# Patient Record
Sex: Female | Born: 1935 | Race: White | Hispanic: No | Marital: Single | Smoking: Never smoker
Health system: Southern US, Community
[De-identification: ages and names within clinical notes are randomized; demographics above are authoritative.]

## PROBLEM LIST (undated history)

## (undated) DIAGNOSIS — I10 Essential (primary) hypertension: Secondary | ICD-10-CM

## (undated) DIAGNOSIS — R000 Tachycardia, unspecified: Secondary | ICD-10-CM

## (undated) DIAGNOSIS — J40 Bronchitis, not specified as acute or chronic: Secondary | ICD-10-CM

## (undated) DIAGNOSIS — F329 Major depressive disorder, single episode, unspecified: Secondary | ICD-10-CM

## (undated) DIAGNOSIS — F419 Anxiety disorder, unspecified: Secondary | ICD-10-CM

## (undated) DIAGNOSIS — F32A Depression, unspecified: Secondary | ICD-10-CM

## (undated) DIAGNOSIS — I6529 Occlusion and stenosis of unspecified carotid artery: Secondary | ICD-10-CM

## (undated) DIAGNOSIS — Z9109 Other allergy status, other than to drugs and biological substances: Secondary | ICD-10-CM

## (undated) DIAGNOSIS — E7800 Pure hypercholesterolemia, unspecified: Secondary | ICD-10-CM

## (undated) DIAGNOSIS — R053 Chronic cough: Secondary | ICD-10-CM

## (undated) DIAGNOSIS — R05 Cough: Secondary | ICD-10-CM

## (undated) DIAGNOSIS — G500 Trigeminal neuralgia: Secondary | ICD-10-CM

## (undated) DIAGNOSIS — K5790 Diverticulosis of intestine, part unspecified, without perforation or abscess without bleeding: Secondary | ICD-10-CM

## (undated) DIAGNOSIS — H9190 Unspecified hearing loss, unspecified ear: Secondary | ICD-10-CM

## (undated) DIAGNOSIS — K5792 Diverticulitis of intestine, part unspecified, without perforation or abscess without bleeding: Secondary | ICD-10-CM

## (undated) DIAGNOSIS — R002 Palpitations: Secondary | ICD-10-CM

## (undated) DIAGNOSIS — E039 Hypothyroidism, unspecified: Secondary | ICD-10-CM

## (undated) DIAGNOSIS — J349 Unspecified disorder of nose and nasal sinuses: Secondary | ICD-10-CM

## (undated) DIAGNOSIS — G629 Polyneuropathy, unspecified: Secondary | ICD-10-CM

## (undated) DIAGNOSIS — C50919 Malignant neoplasm of unspecified site of unspecified female breast: Secondary | ICD-10-CM

## (undated) DIAGNOSIS — M810 Age-related osteoporosis without current pathological fracture: Secondary | ICD-10-CM

## (undated) HISTORY — PX: MASTECTOMY: SHX3

## (undated) HISTORY — PX: THYROIDECTOMY, PARTIAL: SHX18

## (undated) HISTORY — PX: ABDOMINAL HYSTERECTOMY: SHX81

---

## 1999-02-06 ENCOUNTER — Ambulatory Visit (HOSPITAL_COMMUNITY): Admission: RE | Admit: 1999-02-06 | Discharge: 1999-02-06 | Payer: Self-pay | Admitting: Obstetrics & Gynecology

## 2004-02-14 ENCOUNTER — Other Ambulatory Visit: Payer: Self-pay

## 2004-02-15 ENCOUNTER — Other Ambulatory Visit: Payer: Self-pay

## 2004-10-04 ENCOUNTER — Ambulatory Visit: Payer: Self-pay | Admitting: Gastroenterology

## 2004-10-24 ENCOUNTER — Ambulatory Visit: Payer: Self-pay | Admitting: Gastroenterology

## 2004-10-26 ENCOUNTER — Ambulatory Visit: Payer: Self-pay | Admitting: Gastroenterology

## 2005-05-31 ENCOUNTER — Other Ambulatory Visit: Payer: Self-pay

## 2005-05-31 ENCOUNTER — Emergency Department: Payer: Self-pay | Admitting: Emergency Medicine

## 2007-04-04 ENCOUNTER — Emergency Department: Payer: Self-pay | Admitting: General Practice

## 2007-04-04 ENCOUNTER — Other Ambulatory Visit: Payer: Self-pay

## 2007-04-09 ENCOUNTER — Ambulatory Visit: Payer: Self-pay | Admitting: Family Medicine

## 2007-11-05 ENCOUNTER — Other Ambulatory Visit: Payer: Self-pay

## 2007-11-05 ENCOUNTER — Emergency Department: Payer: Self-pay | Admitting: Emergency Medicine

## 2007-11-10 ENCOUNTER — Emergency Department: Payer: Self-pay | Admitting: Emergency Medicine

## 2007-11-10 ENCOUNTER — Other Ambulatory Visit: Payer: Self-pay

## 2008-02-11 ENCOUNTER — Other Ambulatory Visit: Payer: Self-pay

## 2008-02-12 ENCOUNTER — Inpatient Hospital Stay: Payer: Self-pay | Admitting: Internal Medicine

## 2008-04-06 ENCOUNTER — Emergency Department: Payer: Self-pay | Admitting: Emergency Medicine

## 2008-04-06 ENCOUNTER — Other Ambulatory Visit: Payer: Self-pay

## 2009-08-02 ENCOUNTER — Ambulatory Visit: Payer: Self-pay | Admitting: Gastroenterology

## 2009-12-15 ENCOUNTER — Inpatient Hospital Stay: Payer: Self-pay | Admitting: Internal Medicine

## 2010-10-05 ENCOUNTER — Ambulatory Visit: Payer: Self-pay | Admitting: Unknown Physician Specialty

## 2011-06-11 ENCOUNTER — Ambulatory Visit: Payer: Self-pay | Admitting: Pain Medicine

## 2011-06-18 ENCOUNTER — Ambulatory Visit: Payer: Self-pay | Admitting: Pain Medicine

## 2011-06-20 ENCOUNTER — Ambulatory Visit: Payer: Self-pay | Admitting: Pain Medicine

## 2011-07-16 ENCOUNTER — Ambulatory Visit: Payer: Self-pay | Admitting: Pain Medicine

## 2011-08-20 ENCOUNTER — Ambulatory Visit: Payer: Self-pay | Admitting: Pain Medicine

## 2011-09-12 ENCOUNTER — Ambulatory Visit: Payer: Self-pay | Admitting: Pain Medicine

## 2011-10-01 DIAGNOSIS — G500 Trigeminal neuralgia: Secondary | ICD-10-CM | POA: Insufficient documentation

## 2011-10-10 ENCOUNTER — Ambulatory Visit: Payer: Self-pay | Admitting: Pain Medicine

## 2011-10-24 ENCOUNTER — Ambulatory Visit: Payer: Self-pay | Admitting: Pain Medicine

## 2011-12-26 ENCOUNTER — Ambulatory Visit: Payer: Self-pay | Admitting: Pain Medicine

## 2012-04-08 ENCOUNTER — Ambulatory Visit: Payer: Self-pay | Admitting: Pain Medicine

## 2012-04-10 DIAGNOSIS — C50919 Malignant neoplasm of unspecified site of unspecified female breast: Secondary | ICD-10-CM | POA: Insufficient documentation

## 2013-06-24 ENCOUNTER — Ambulatory Visit: Payer: Self-pay | Admitting: Family Medicine

## 2014-01-08 DIAGNOSIS — G629 Polyneuropathy, unspecified: Secondary | ICD-10-CM | POA: Insufficient documentation

## 2014-01-08 DIAGNOSIS — I4711 Inappropriate sinus tachycardia, so stated: Secondary | ICD-10-CM | POA: Insufficient documentation

## 2014-01-08 DIAGNOSIS — K219 Gastro-esophageal reflux disease without esophagitis: Secondary | ICD-10-CM | POA: Insufficient documentation

## 2014-01-08 DIAGNOSIS — E039 Hypothyroidism, unspecified: Secondary | ICD-10-CM | POA: Diagnosis present

## 2014-01-08 DIAGNOSIS — E782 Mixed hyperlipidemia: Secondary | ICD-10-CM | POA: Insufficient documentation

## 2014-12-08 DIAGNOSIS — I071 Rheumatic tricuspid insufficiency: Secondary | ICD-10-CM | POA: Insufficient documentation

## 2014-12-08 DIAGNOSIS — I340 Nonrheumatic mitral (valve) insufficiency: Secondary | ICD-10-CM | POA: Insufficient documentation

## 2014-12-08 DIAGNOSIS — I6523 Occlusion and stenosis of bilateral carotid arteries: Secondary | ICD-10-CM | POA: Insufficient documentation

## 2015-04-05 ENCOUNTER — Other Ambulatory Visit: Payer: Self-pay | Admitting: Family Medicine

## 2015-04-05 DIAGNOSIS — R599 Enlarged lymph nodes, unspecified: Secondary | ICD-10-CM

## 2015-04-12 ENCOUNTER — Ambulatory Visit
Admission: RE | Admit: 2015-04-12 | Discharge: 2015-04-12 | Disposition: A | Discharge status: Home / Self Care | Payer: Medicare Other | Source: Ambulatory Visit | Attending: Family Medicine | Admitting: Family Medicine

## 2015-04-12 DIAGNOSIS — R599 Enlarged lymph nodes, unspecified: Secondary | ICD-10-CM

## 2015-04-12 DIAGNOSIS — R938 Abnormal findings on diagnostic imaging of other specified body structures: Secondary | ICD-10-CM | POA: Insufficient documentation

## 2015-04-20 ENCOUNTER — Encounter
Admission: RE | Admit: 2015-04-20 | Discharge: 2015-04-20 | Disposition: A | Discharge status: Home / Self Care | Payer: Medicare Other | Source: Ambulatory Visit | Attending: Ophthalmology | Admitting: Ophthalmology

## 2015-04-20 DIAGNOSIS — I10 Essential (primary) hypertension: Secondary | ICD-10-CM | POA: Insufficient documentation

## 2015-04-20 DIAGNOSIS — Z01810 Encounter for preprocedural cardiovascular examination: Secondary | ICD-10-CM | POA: Diagnosis present

## 2015-04-20 HISTORY — DX: Diverticulosis of intestine, part unspecified, without perforation or abscess without bleeding: K57.90

## 2015-04-20 HISTORY — DX: Polyneuropathy, unspecified: G62.9

## 2015-04-20 HISTORY — DX: Diverticulitis of intestine, part unspecified, without perforation or abscess without bleeding: K57.92

## 2015-04-20 HISTORY — DX: Palpitations: R00.2

## 2015-04-20 HISTORY — DX: Chronic cough: R05.3

## 2015-04-20 HISTORY — DX: Malignant neoplasm of unspecified site of unspecified female breast: C50.919

## 2015-04-20 HISTORY — DX: Age-related osteoporosis without current pathological fracture: M81.0

## 2015-04-20 HISTORY — DX: Occlusion and stenosis of unspecified carotid artery: I65.29

## 2015-04-20 HISTORY — DX: Essential (primary) hypertension: I10

## 2015-04-20 HISTORY — DX: Unspecified disorder of nose and nasal sinuses: J34.9

## 2015-04-20 HISTORY — DX: Trigeminal neuralgia: G50.0

## 2015-04-20 HISTORY — DX: Hypothyroidism, unspecified: E03.9

## 2015-04-20 HISTORY — DX: Unspecified hearing loss, unspecified ear: H91.90

## 2015-04-20 HISTORY — DX: Anxiety disorder, unspecified: F41.9

## 2015-04-20 HISTORY — DX: Other allergy status, other than to drugs and biological substances: Z91.09

## 2015-04-20 HISTORY — DX: Tachycardia, unspecified: R00.0

## 2015-04-20 HISTORY — DX: Bronchitis, not specified as acute or chronic: J40

## 2015-04-20 HISTORY — DX: Pure hypercholesterolemia, unspecified: E78.00

## 2015-04-20 HISTORY — DX: Cough: R05

## 2015-04-21 NOTE — OR Nursing (Signed)
Ekg reviewed by Dr Polin and ok 

## 2015-05-03 ENCOUNTER — Encounter
Admission: RE | Disposition: A | Discharge status: Home / Self Care | Payer: Self-pay | Source: Ambulatory Visit | Attending: Ophthalmology

## 2015-05-03 ENCOUNTER — Encounter: Payer: Self-pay | Admitting: Ophthalmology

## 2015-05-03 ENCOUNTER — Ambulatory Visit
Admission: RE | Admit: 2015-05-03 | Discharge: 2015-05-03 | Disposition: A | Discharge status: Home / Self Care | Payer: Medicare Other | Source: Ambulatory Visit | Attending: Ophthalmology | Admitting: Ophthalmology

## 2015-05-03 ENCOUNTER — Ambulatory Visit: Payer: Medicare Other | Admitting: Anesthesiology

## 2015-05-03 DIAGNOSIS — M810 Age-related osteoporosis without current pathological fracture: Secondary | ICD-10-CM | POA: Insufficient documentation

## 2015-05-03 DIAGNOSIS — Z79899 Other long term (current) drug therapy: Secondary | ICD-10-CM | POA: Diagnosis not present

## 2015-05-03 DIAGNOSIS — E039 Hypothyroidism, unspecified: Secondary | ICD-10-CM | POA: Insufficient documentation

## 2015-05-03 DIAGNOSIS — Z7982 Long term (current) use of aspirin: Secondary | ICD-10-CM | POA: Insufficient documentation

## 2015-05-03 DIAGNOSIS — Z853 Personal history of malignant neoplasm of breast: Secondary | ICD-10-CM | POA: Diagnosis not present

## 2015-05-03 DIAGNOSIS — K219 Gastro-esophageal reflux disease without esophagitis: Secondary | ICD-10-CM | POA: Insufficient documentation

## 2015-05-03 DIAGNOSIS — E780 Pure hypercholesterolemia: Secondary | ICD-10-CM | POA: Insufficient documentation

## 2015-05-03 DIAGNOSIS — I10 Essential (primary) hypertension: Secondary | ICD-10-CM | POA: Diagnosis not present

## 2015-05-03 DIAGNOSIS — H2511 Age-related nuclear cataract, right eye: Secondary | ICD-10-CM | POA: Diagnosis not present

## 2015-05-03 DIAGNOSIS — Z880 Allergy status to penicillin: Secondary | ICD-10-CM | POA: Diagnosis not present

## 2015-05-03 DIAGNOSIS — E119 Type 2 diabetes mellitus without complications: Secondary | ICD-10-CM | POA: Insufficient documentation

## 2015-05-03 DIAGNOSIS — Z885 Allergy status to narcotic agent status: Secondary | ICD-10-CM | POA: Insufficient documentation

## 2015-05-03 DIAGNOSIS — I739 Peripheral vascular disease, unspecified: Secondary | ICD-10-CM | POA: Diagnosis not present

## 2015-05-03 DIAGNOSIS — H9190 Unspecified hearing loss, unspecified ear: Secondary | ICD-10-CM | POA: Diagnosis not present

## 2015-05-03 DIAGNOSIS — Z881 Allergy status to other antibiotic agents status: Secondary | ICD-10-CM | POA: Insufficient documentation

## 2015-05-03 HISTORY — PX: CATARACT EXTRACTION W/PHACO: SHX586

## 2015-05-03 SURGERY — PHACOEMULSIFICATION, CATARACT, WITH IOL INSERTION
Anesthesia: Monitor Anesthesia Care | Site: Eye | Laterality: Right | Wound class: Clean

## 2015-05-03 MED ORDER — NA CHONDROIT SULF-NA HYALURON 40-17 MG/ML IO SOLN
INTRAOCULAR | Status: AC
  Filled 2015-05-03: qty 1

## 2015-05-03 MED ORDER — MOXIFLOXACIN HCL 0.5 % OP SOLN: 1.0000 [drp] | OPHTHALMIC | Status: DC | PRN

## 2015-05-03 MED ORDER — MIDAZOLAM HCL 2 MG/2ML IJ SOLN
INTRAMUSCULAR | Status: DC | PRN
  Administered 2015-05-03: 1 mg via INTRAVENOUS

## 2015-05-03 MED ORDER — EPINEPHRINE HCL 1 MG/ML IJ SOLN
INTRAMUSCULAR | Status: AC
  Filled 2015-05-03: qty 1

## 2015-05-03 MED ORDER — SODIUM CHLORIDE 0.9 % IV SOLN
INTRAVENOUS | Status: DC
  Administered 2015-05-03: 10:00:00 via INTRAVENOUS

## 2015-05-03 MED ORDER — CARBACHOL 0.01 % IO SOLN
INTRAOCULAR | Status: DC | PRN
  Administered 2015-05-03: 0.5 mL via INTRAOCULAR

## 2015-05-03 MED ORDER — ARMC OPHTHALMIC DILATING GEL
1.0000 "application " | Freq: Once | OPHTHALMIC | Status: AC
  Administered 2015-05-03 (×2): 1 via OPHTHALMIC

## 2015-05-03 MED ORDER — TETRACAINE HCL 0.5 % OP SOLN
1.0000 [drp] | Freq: Once | OPHTHALMIC | Status: AC
  Administered 2015-05-03: 1 [drp] via OPHTHALMIC

## 2015-05-03 MED ORDER — POVIDONE-IODINE 5 % OP SOLN
1.0000 "application " | Freq: Once | OPHTHALMIC | Status: AC
  Administered 2015-05-03: 1 via OPHTHALMIC

## 2015-05-03 MED ORDER — LACTATED RINGERS IV SOLN: INTRAVENOUS | Status: DC

## 2015-05-03 MED ORDER — CEFUROXIME OPHTHALMIC INJECTION 1 MG/0.1 ML
INJECTION | OPHTHALMIC | Status: AC
  Filled 2015-05-03: qty 0.1

## 2015-05-03 MED ORDER — EPINEPHRINE HCL 1 MG/ML IJ SOLN
INTRAOCULAR | Status: DC | PRN
  Administered 2015-05-03: 200 mL

## 2015-05-03 MED ORDER — MOXIFLOXACIN HCL 0.5 % OP SOLN
OPHTHALMIC | Status: DC | PRN
  Administered 2015-05-03: 1 [drp] via OPHTHALMIC

## 2015-05-03 SURGICAL SUPPLY — 22 items
CANNULA ANT/CHMB 27GA (MISCELLANEOUS) ×3 IMPLANT
GLOVE BIO SURGEON STRL SZ8 (GLOVE) ×3 IMPLANT
GLOVE BIOGEL M 6.5 STRL (GLOVE) ×3 IMPLANT
GLOVE SURG LX 8.0 MICRO (GLOVE) ×2
GLOVE SURG LX STRL 8.0 MICRO (GLOVE) ×1 IMPLANT
GOWN STRL REUS W/ TWL LRG LVL3 (GOWN DISPOSABLE) ×2 IMPLANT
GOWN STRL REUS W/TWL LRG LVL3 (GOWN DISPOSABLE) ×6
LENS IOL TECNIS 24.0 (Intraocular Lens) ×3 IMPLANT
LENS IOL TECNIS MONO 1P 24.0 (Intraocular Lens) ×1 IMPLANT
PACK CATARACT (MISCELLANEOUS) ×3 IMPLANT
PACK CATARACT BRASINGTON LX (MISCELLANEOUS) ×3 IMPLANT
PACK EYE AFTER SURG (MISCELLANEOUS) ×3 IMPLANT
PACK VITRECTOMY CASSETTE 23GA (MISCELLANEOUS) IMPLANT
PACK VITRECTOMY CASSETTE 25GA (MISCELLANEOUS) IMPLANT
SOL BSS BAG (MISCELLANEOUS) ×3
SOL PREP PVP 2OZ (MISCELLANEOUS) ×3
SOLUTION BSS BAG (MISCELLANEOUS) ×1 IMPLANT
SOLUTION PREP PVP 2OZ (MISCELLANEOUS) ×1 IMPLANT
SYR 5ML LL (SYRINGE) ×3 IMPLANT
SYR TB 1ML 27GX1/2 LL (SYRINGE) ×3 IMPLANT
WATER STERILE IRR 1000ML POUR (IV SOLUTION) ×3 IMPLANT
WIPE NON LINTING 3.25X3.25 (MISCELLANEOUS) ×3 IMPLANT

## 2015-05-03 NOTE — H&P (Signed)
  All labs reviewed. Abnormal studies sent to patients PCP when indicated.  Previous H&P reviewed, patient examined, there are NO CHANGES.  Bonnie Barker LOUIS6/28/201610:58 AM

## 2015-05-03 NOTE — Anesthesia Preprocedure Evaluation (Signed)
Anesthesia Evaluation  Patient identified by MRN, date of birth, ID band Patient awake    Reviewed: Allergy & Precautions, H&P , NPO status , Patient's Chart, lab work & pertinent test results, reviewed documented beta blocker date and time   Airway Mallampati: II  TM Distance: >3 FB Neck ROM: full    Dental no notable dental hx. (+) Teeth Intact   Pulmonary neg pulmonary ROS,  breath sounds clear to auscultation  Pulmonary exam normal       Cardiovascular Exercise Tolerance: Good hypertension, + Peripheral Vascular Disease negative cardio ROS  Rhythm:regular Rate:Normal     Neuro/Psych  Neuromuscular disease negative neurological ROS  negative psych ROS   GI/Hepatic negative GI ROS, Neg liver ROS,   Endo/Other  negative endocrine ROSdiabetesHypothyroidism   Renal/GU      Musculoskeletal   Abdominal   Peds  Hematology negative hematology ROS (+)   Anesthesia Other Findings   Reproductive/Obstetrics negative OB ROS                             Anesthesia Physical Anesthesia Plan  ASA: III  Anesthesia Plan: MAC   Post-op Pain Management:    Induction:   Airway Management Planned:   Additional Equipment:   Intra-op Plan:   Post-operative Plan:   Informed Consent: I have reviewed the patients History and Physical, chart, labs and discussed the procedure including the risks, benefits and alternatives for the proposed anesthesia with the patient or authorized representative who has indicated his/her understanding and acceptance.     Plan Discussed with: CRNA  Anesthesia Plan Comments:         Anesthesia Quick Evaluation

## 2015-05-03 NOTE — Op Note (Signed)
PREOPERATIVE DIAGNOSIS:  Nuclear sclerotic cataract of the right eye.   POSTOPERATIVE DIAGNOSIS:  nuclear sclerotic cataract right eye   OPERATIVE PROCEDURE: Procedure(s): CATARACT EXTRACTION PHACO AND INTRAOCULAR LENS PLACEMENT (IOC)   SURGEON:  Birder Robson, MD.   ANESTHESIA:  Anesthesiologist: Molli Barrows, MD CRNA: Aline Brochure, CRNA  1.      Managed anesthesia care. 2.      Topical tetracaine drops followed by 2% Xylocaine jelly applied in the preoperative holding area.   COMPLICATIONS:  None.   TECHNIQUE:   Stop and chop   DESCRIPTION OF PROCEDURE:  The patient was examined and consented in the preoperative holding area where the aforementioned topical anesthesia was applied to the right eye and then brought back to the Operating Room where the right eye was prepped and draped in the usual sterile ophthalmic fashion and a lid speculum was placed. A paracentesis was created with the side port blade and the anterior chamber was filled with viscoelastic. A near clear corneal incision was performed with the steel keratome. A continuous curvilinear capsulorrhexis was performed with a cystotome followed by the capsulorrhexis forceps. Hydrodissection and hydrodelineation were carried out with BSS on a blunt cannula. The lens was removed in a stop and chop  technique and the remaining cortical material was removed with the irrigation-aspiration handpiece. The capsular bag was inflated with viscoelastic and the Technis ZCB00  lens was placed in the capsular bag without complication. The remaining viscoelastic was removed from the eye with the irrigation-aspiration handpiece. The wounds were hydrated. The anterior chamber was flushed with Miostat and the eye was inflated to physiologic pressure. 0.2 mL of Vigamox diluted three/one with BSS was placed in the anterior chamber. The wounds were found to be water tight. The eye was dressed with Vigamox. The patient was given protective glasses  to wear throughout the day and a shield with which to sleep tonight. The patient was also given drops with which to begin a drop regimen today and will follow-up with me in one day.  Implant Name Type Inv. Item Serial No. Manufacturer Lot No. LRB No. Used  LENS IMPL INTRAOC ZCB00 24.0 - [id number] Intraocular Lens LENS IMPL INTRAOC ZCB00 24.0 [id number] AMO   Right 1   Procedure(s) with comments: CATARACT EXTRACTION PHACO AND INTRAOCULAR LENS PLACEMENT (IOC) (Right) - [place] 00:54 AP% 18.8 CDE 10.21 fluid pack lot # [id number]  Electronically signed: [place] 05/03/2015 11:24 AM

## 2015-05-03 NOTE — Discharge Instructions (Addendum)
See cataract post op handout  Eye Surgery Discharge Instructions  Expect mild scratchy sensation or mild soreness. DO NOT RUB YOUR EYE!  The day of surgery:  Minimal physical activity, but bed rest is not required  No reading, computer work, or close hand work  No bending, lifting, or straining.  May watch TV  For 24 hours:  No driving, legal decisions, or alcoholic beverages  Safety precautions  Eat anything you prefer: It is better to start with liquids, then soup then solid foods.  _____ Eye patch should be worn until postoperative exam tomorrow.  ____ Solar shield eyeglasses should be worn for comfort in the sunlight/patch while sleeping  Resume all regular medications including aspirin or Coumadin if these were discontinued prior to surgery. You may shower, bathe, shave, or wash your hair. Tylenol may be taken for mild discomfort.  Call your doctor if you experience significant pain, nausea, or vomiting, fever > 101 or other signs of infection. [phone] or [phone] Specific instructions:  Follow-up Information    Follow up with Tim Lair, MD On 05/04/2015.   Specialty:  Ophthalmology   Why:  8:30   Contact information:   [address] [place] [address] [phone]      Eye Surgery Discharge Instructions  Expect mild scratchy sensation or mild soreness. DO NOT RUB YOUR EYE!  The day of surgery:  Minimal physical activity, but bed rest is not required  No reading, computer work, or close hand work  No bending, lifting, or straining.  May watch TV  For 24 hours:  No driving, legal decisions, or alcoholic beverages  Safety precautions  Eat anything you prefer: It is better to start with liquids, then soup then solid foods.  _____ Eye patch should be worn until postoperative exam tomorrow.  ____ Solar shield eyeglasses should be worn for comfort in the sunlight/patch while sleeping  Resume all regular medications  including aspirin or Coumadin if these were discontinued prior to surgery. You may shower, bathe, shave, or wash your hair. Tylenol may be taken for mild discomfort.  Call your doctor if you experience significant pain, nausea, or vomiting, fever > 101 or other signs of infection. [phone] or [phone] Specific instructions:  Follow-up Information    Follow up with Tim Lair, MD On 05/04/2015.   Specialty:  Ophthalmology   Why:  8:30   Contact information:   [address] [place] [address] [phone]

## 2015-05-03 NOTE — Anesthesia Postprocedure Evaluation (Signed)
  Anesthesia Post-op Note  Patient: Bonnie Barker  Procedure(s) Performed: Procedure(s) with comments: CATARACT EXTRACTION PHACO AND INTRAOCULAR LENS PLACEMENT (IOC) (Right) - [place] 00:54 AP% 18.8 CDE 10.21 fluid pack lot # [id number]  Anesthesia type:MAC  Patient location: PACU  Post pain: Pain level controlled  Post assessment: Post-op Vital signs reviewed, Patient's Cardiovascular Status Stable, Respiratory Function Stable, Patent Airway and No signs of Nausea or vomiting  Post vital signs: Reviewed and stable  Last Vitals:  Filed Vitals:   05/03/15 1126  BP: 162/90  Pulse: 92  Temp: 36.1 C  Resp: 12    Level of consciousness: awake, alert  and patient cooperative  Complications: No apparent anesthesia complications

## 2015-05-03 NOTE — Transfer of Care (Signed)
Immediate Anesthesia Transfer of Care Note  Patient: Bonnie Barker  Procedure(s) Performed: Procedure(s) with comments: CATARACT EXTRACTION PHACO AND INTRAOCULAR LENS PLACEMENT (IOC) (Right) - [place] 00:54 AP% 18.8 CDE 10.21 fluid pack lot # [id number]  Patient Location: PACU  Anesthesia Type:MAC  Level of Consciousness: awake, alert  and oriented  Airway & Oxygen Therapy: Patient Spontanous Breathing  Post-op Assessment: Report given to RN and Post -op Vital signs reviewed and stable  Post vital signs: stable  Last Vitals:  Filed Vitals:   05/03/15 1126  BP: 162/90  Pulse: 92  Temp: 36.1 C  Resp: 12    Complications: No apparent anesthesia complications

## 2015-05-13 DIAGNOSIS — H2512 Age-related nuclear cataract, left eye: Secondary | ICD-10-CM | POA: Diagnosis present

## 2015-05-13 DIAGNOSIS — I739 Peripheral vascular disease, unspecified: Secondary | ICD-10-CM | POA: Diagnosis not present

## 2015-05-13 DIAGNOSIS — G709 Myoneural disorder, unspecified: Secondary | ICD-10-CM | POA: Diagnosis not present

## 2015-05-13 DIAGNOSIS — Z792 Long term (current) use of antibiotics: Secondary | ICD-10-CM | POA: Diagnosis not present

## 2015-05-13 DIAGNOSIS — E039 Hypothyroidism, unspecified: Secondary | ICD-10-CM | POA: Diagnosis not present

## 2015-05-13 DIAGNOSIS — F419 Anxiety disorder, unspecified: Secondary | ICD-10-CM | POA: Diagnosis not present

## 2015-05-13 DIAGNOSIS — K219 Gastro-esophageal reflux disease without esophagitis: Secondary | ICD-10-CM | POA: Diagnosis not present

## 2015-05-13 DIAGNOSIS — F329 Major depressive disorder, single episode, unspecified: Secondary | ICD-10-CM | POA: Diagnosis not present

## 2015-05-13 DIAGNOSIS — Z7983 Long term (current) use of bisphosphonates: Secondary | ICD-10-CM | POA: Diagnosis not present

## 2015-05-13 DIAGNOSIS — I10 Essential (primary) hypertension: Secondary | ICD-10-CM | POA: Diagnosis not present

## 2015-05-13 DIAGNOSIS — Z79899 Other long term (current) drug therapy: Secondary | ICD-10-CM | POA: Diagnosis not present

## 2015-05-17 ENCOUNTER — Ambulatory Visit: Payer: Medicare Other | Admitting: Anesthesiology

## 2015-05-17 ENCOUNTER — Encounter: Payer: Self-pay | Admitting: *Deleted

## 2015-05-17 ENCOUNTER — Ambulatory Visit
Admission: RE | Admit: 2015-05-17 | Discharge: 2015-05-17 | Disposition: A | Discharge status: Home / Self Care | Payer: Medicare Other | Source: Ambulatory Visit | Attending: Ophthalmology | Admitting: Ophthalmology

## 2015-05-17 ENCOUNTER — Encounter
Admission: RE | Disposition: A | Discharge status: Home / Self Care | Payer: Self-pay | Source: Ambulatory Visit | Attending: Ophthalmology

## 2015-05-17 DIAGNOSIS — G709 Myoneural disorder, unspecified: Secondary | ICD-10-CM | POA: Insufficient documentation

## 2015-05-17 DIAGNOSIS — K219 Gastro-esophageal reflux disease without esophagitis: Secondary | ICD-10-CM | POA: Insufficient documentation

## 2015-05-17 DIAGNOSIS — I739 Peripheral vascular disease, unspecified: Secondary | ICD-10-CM | POA: Insufficient documentation

## 2015-05-17 DIAGNOSIS — F419 Anxiety disorder, unspecified: Secondary | ICD-10-CM | POA: Insufficient documentation

## 2015-05-17 DIAGNOSIS — Z7983 Long term (current) use of bisphosphonates: Secondary | ICD-10-CM | POA: Insufficient documentation

## 2015-05-17 DIAGNOSIS — I10 Essential (primary) hypertension: Secondary | ICD-10-CM | POA: Insufficient documentation

## 2015-05-17 DIAGNOSIS — Z79899 Other long term (current) drug therapy: Secondary | ICD-10-CM | POA: Insufficient documentation

## 2015-05-17 DIAGNOSIS — E039 Hypothyroidism, unspecified: Secondary | ICD-10-CM | POA: Insufficient documentation

## 2015-05-17 DIAGNOSIS — Z792 Long term (current) use of antibiotics: Secondary | ICD-10-CM | POA: Insufficient documentation

## 2015-05-17 DIAGNOSIS — H2512 Age-related nuclear cataract, left eye: Secondary | ICD-10-CM | POA: Diagnosis not present

## 2015-05-17 DIAGNOSIS — F329 Major depressive disorder, single episode, unspecified: Secondary | ICD-10-CM | POA: Insufficient documentation

## 2015-05-17 HISTORY — DX: Depression, unspecified: F32.A

## 2015-05-17 HISTORY — PX: CATARACT EXTRACTION W/PHACO: SHX586

## 2015-05-17 HISTORY — DX: Major depressive disorder, single episode, unspecified: F32.9

## 2015-05-17 SURGERY — PHACOEMULSIFICATION, CATARACT, WITH IOL INSERTION
Anesthesia: Monitor Anesthesia Care | Site: Eye | Laterality: Left

## 2015-05-17 MED ORDER — MOXIFLOXACIN HCL 0.5 % OP SOLN: 1.0000 [drp] | OPHTHALMIC | Status: AC | PRN

## 2015-05-17 MED ORDER — MOXIFLOXACIN HCL 0.5 % OP SOLN
OPHTHALMIC | Status: AC
  Filled 2015-05-17: qty 3

## 2015-05-17 MED ORDER — MIDAZOLAM HCL 2 MG/2ML IJ SOLN
INTRAMUSCULAR | Status: DC | PRN
  Administered 2015-05-17 (×2): .5 mg via INTRAVENOUS

## 2015-05-17 MED ORDER — ARMC OPHTHALMIC DILATING GEL
OPHTHALMIC | Status: AC
  Filled 2015-05-17: qty 0.25

## 2015-05-17 MED ORDER — EPINEPHRINE HCL 1 MG/ML IJ SOLN
INTRAOCULAR | Status: DC | PRN
  Administered 2015-05-17: 200 mL

## 2015-05-17 MED ORDER — POVIDONE-IODINE 5 % OP SOLN
OPHTHALMIC | Status: AC
  Administered 2015-05-17: 1 via OPHTHALMIC
  Filled 2015-05-17: qty 30

## 2015-05-17 MED ORDER — MOXIFLOXACIN HCL 0.5 % OP SOLN
OPHTHALMIC | Status: DC | PRN
Start: 2015-05-17 — End: 2015-05-17
  Administered 2015-05-17: 1 [drp] via OPHTHALMIC

## 2015-05-17 MED ORDER — FENTANYL CITRATE (PF) 100 MCG/2ML IJ SOLN
INTRAMUSCULAR | Status: DC | PRN
  Administered 2015-05-17 (×2): 25 ug via INTRAVENOUS

## 2015-05-17 MED ORDER — SODIUM CHLORIDE 0.9 % IV SOLN
INTRAVENOUS | Status: DC
  Administered 2015-05-17 (×2): via INTRAVENOUS

## 2015-05-17 MED ORDER — EPINEPHRINE HCL 1 MG/ML IJ SOLN
INTRAMUSCULAR | Status: AC
  Filled 2015-05-17: qty 1

## 2015-05-17 MED ORDER — CEFUROXIME OPHTHALMIC INJECTION 1 MG/0.1 ML
INJECTION | OPHTHALMIC | Status: AC
  Filled 2015-05-17: qty 0.1

## 2015-05-17 MED ORDER — TETRACAINE HCL 0.5 % OP SOLN
OPHTHALMIC | Status: AC
  Filled 2015-05-17: qty 2

## 2015-05-17 MED ORDER — CARBACHOL 0.01 % IO SOLN
INTRAOCULAR | Status: DC | PRN
Start: 2015-05-17 — End: 2015-05-17
  Administered 2015-05-17: 0.5 mL via INTRAOCULAR

## 2015-05-17 MED ORDER — NA CHONDROIT SULF-NA HYALURON 40-17 MG/ML IO SOLN
INTRAOCULAR | Status: AC
  Filled 2015-05-17: qty 1

## 2015-05-17 MED ORDER — TETRACAINE HCL 0.5 % OP SOLN
1.0000 [drp] | OPHTHALMIC | Status: DC | PRN
  Administered 2015-05-17: 1 [drp] via OPHTHALMIC

## 2015-05-17 MED ORDER — ARMC OPHTHALMIC DILATING GEL
1.0000 "application " | OPHTHALMIC | Status: DC | PRN
  Administered 2015-05-17 (×2): 1 via OPHTHALMIC

## 2015-05-17 MED ORDER — POVIDONE-IODINE 5 % OP SOLN
1.0000 "application " | OPHTHALMIC | Status: DC | PRN
  Administered 2015-05-17: 1 via OPHTHALMIC

## 2015-05-17 SURGICAL SUPPLY — 20 items
CANNULA ANT/CHMB 27GA (MISCELLANEOUS) ×2 IMPLANT
GLOVE BIO SURGEON STRL SZ8 (GLOVE) ×2 IMPLANT
GLOVE BIOGEL M 6.5 STRL (GLOVE) ×2 IMPLANT
GLOVE SURG LX 8.0 MICRO (GLOVE) ×1
GLOVE SURG LX STRL 8.0 MICRO (GLOVE) ×1 IMPLANT
GOWN STRL REUS W/ TWL LRG LVL3 (GOWN DISPOSABLE) ×2 IMPLANT
GOWN STRL REUS W/TWL LRG LVL3 (GOWN DISPOSABLE) ×4
LENS IOL TECNIS 23.5 (Intraocular Lens) ×2 IMPLANT
LENS IOL TECNIS MONO 1P 23.5 (Intraocular Lens) ×1 IMPLANT
PACK CATARACT (MISCELLANEOUS) ×2 IMPLANT
PACK CATARACT BRASINGTON LX (MISCELLANEOUS) ×2 IMPLANT
PACK EYE AFTER SURG (MISCELLANEOUS) ×2 IMPLANT
SOL BSS BAG (MISCELLANEOUS) ×2
SOL PREP PVP 2OZ (MISCELLANEOUS) ×2
SOLUTION BSS BAG (MISCELLANEOUS) ×1 IMPLANT
SOLUTION PREP PVP 2OZ (MISCELLANEOUS) ×1 IMPLANT
SYR 5ML LL (SYRINGE) ×2 IMPLANT
SYR TB 1ML 27GX1/2 LL (SYRINGE) ×2 IMPLANT
WATER STERILE IRR 1000ML POUR (IV SOLUTION) ×2 IMPLANT
WIPE NON LINTING 3.25X3.25 (MISCELLANEOUS) ×2 IMPLANT

## 2015-05-17 NOTE — Discharge Instructions (Signed)
AMBULATORY SURGERY  °DISCHARGE INSTRUCTIONS ° ° °1) The drugs that you were given will stay in your system until tomorrow so for the next 24 hours you should not: ° °A) Drive an automobile °B) Make any legal decisions °C) Drink any alcoholic beverage ° ° °2) You may resume regular meals tomorrow.  Today it is better to start with liquids and gradually work up to solid foods. ° °You may eat anything you prefer, but it is better to start with liquids, then soup and crackers, and gradually work up to solid foods. ° ° °3) Please notify your doctor immediately if you have any unusual bleeding, trouble breathing, redness and pain at the surgery site, drainage, fever, or pain not relieved by medication. ° ° ° °4) Additional Instructions: ° ° ° °Cataract Surgery °Care After °Refer to this sheet in the next few weeks. These instructions provide you with information on caring for yourself after your procedure. Your caregiver may also give you more specific instructions. Your treatment has been planned according to current medical practices, but problems sometimes occur. Call your caregiver if you have any problems or questions after your procedure.  °HOME CARE INSTRUCTIONS  °· Avoid strenuous activities as directed by your caregiver. °· Ask your caregiver when you can resume driving. °· Use eyedrops or other medicines to help healing and control pressure inside your eye as directed by your caregiver. °· Only take over-the-counter or prescription medicines for pain, discomfort, or fever as directed by your caregiver. °· Do not to touch or rub your eyes. °· You may be instructed to use a protective shield during the first few days and nights after surgery. If not, wear sunglasses to protect your eyes. This is to protect the eye from pressure or from being accidentally bumped. °· Keep the area around your eye clean and dry. Avoid swimming or allowing water to hit you directly in the face while showering. Keep soap and shampoo  out of your eyes. °· Do not bend or lift heavy objects. Bending increases pressure in the eye. You can walk, climb stairs, and do light household chores. °· Do not put a contact lens into the eye that had surgery until your caregiver says it is okay to do so. °· Ask your doctor when you can return to work. This will depend on the kind of work that you do. If you work in a dusty environment, you may be advised to wear protective eyewear for a period of time. °· Ask your caregiver when it will be safe to engage in sexual activity. °· Continue with your regular eye exams as directed by your caregiver. °What to expect: °· It is normal to feel itching and mild discomfort for a few days after cataract surgery. Some fluid discharge is also common, and your eye may be sensitive to light and touch. °· After 1 to 2 days, even moderate discomfort should disappear. In most cases, healing will take about 6 weeks. °· If you received an intraocular lens (IOL), you may notice that colors are very bright or have a blue tinge. Also, if you have been in bright sunlight, everything may appear reddish for a few hours. If you see these color tinges, it is because your lens is clear and no longer cloudy. Within a few months after receiving an IOL, these extra colors should go away. When you have healed, you will probably need new glasses. °SEEK MEDICAL CARE IF:  °· You have increased bruising around your   eye. °· You have discomfort not helped by medicine. °SEEK IMMEDIATE MEDICAL CARE IF:  °· You have a  fever. °· You have a worsening or sudden vision loss. °· You have redness, swelling, or increasing pain in the eye. °· You have a thick discharge from the eye that had surgery. °MAKE SURE YOU: °· Understand these instructions. °· Will watch your condition. °· Will get help right away if you are not doing well or get worse. °Document Released: 05/11/2005 Document Revised: 01/14/2012 Document Reviewed: 06/15/2011 °[employer]® Patient  Information ©2015 [employer]. This information is not intended to replace advice given to you by your health care provider. Make sure you discuss any questions you have with your health care provider. ° ° ° ° °Please contact your physician with any problems or Same Day Surgery at [phone], Monday through Friday 6 am to 4 pm, or [facility] Main number at [phone]. °

## 2015-05-17 NOTE — Op Note (Signed)
PREOPERATIVE DIAGNOSIS:  Nuclear sclerotic cataract of the left eye.   POSTOPERATIVE DIAGNOSIS:  Nuclear sclerotic cataract of the left eye.   OPERATIVE PROCEDURE: Procedure(s): CATARACT EXTRACTION PHACO AND INTRAOCULAR LENS PLACEMENT (IOC)   SURGEON:  Birder Robson, MD.   ANESTHESIA:  Anesthesiologist: Gunnar Bulla, MD CRNA: Nelda Marseille, CRNA  1.      Managed anesthesia care. 2.      Topical tetracaine drops followed by 2% Xylocaine jelly applied in the preoperative holding area.   COMPLICATIONS:  None.   TECHNIQUE:   Stop and chop   DESCRIPTION OF PROCEDURE:  The patient was examined and consented in the preoperative holding area where the aforementioned topical anesthesia was applied to the left eye and then brought back to the Operating Room where the left eye was prepped and draped in the usual sterile ophthalmic fashion and a lid speculum was placed. A paracentesis was created with the side port blade and the anterior chamber was filled with viscoelastic. A near clear corneal incision was performed with the steel keratome. A continuous curvilinear capsulorrhexis was performed with a cystotome followed by the capsulorrhexis forceps. Hydrodissection and hydrodelineation were carried out with BSS on a blunt cannula. The lens was removed in a stop and chop  technique and the remaining cortical material was removed with the irrigation-aspiration handpiece. The capsular bag was inflated with viscoelastic and the Technis ZCB00 lens was placed in the capsular bag without complication. The remaining viscoelastic was removed from the eye with the irrigation-aspiration handpiece. The wounds were hydrated. The anterior chamber was flushed with Miostat and the eye was inflated to physiologic pressure. 0.2 mL of Vigamox diluted three/one with BSS was placed in the anterior chamber. The wounds were found to be water tight. The eye was dressed with Vigamox. The patient was given protective glasses to  wear throughout the day and a shield with which to sleep tonight. The patient was also given drops with which to begin a drop regimen today and will follow-up with me in one day.  Implant Name Type Inv. Item Serial No. Manufacturer Lot No. LRB No. Used  LENS IMPL INTRAOC ZCB00 23.5 - [id number] Intraocular Lens LENS IMPL INTRAOC ZCB00 23.5 [id number] AMO   Left 1    Procedure(s) with comments: CATARACT EXTRACTION PHACO AND INTRAOCULAR LENS PLACEMENT (IOC) (Left) - [place] 01:00.5 AP% 17.8 CDE 10.76 fluid pack lot # [id number]  Electronically signed: [place] 05/17/2015 10:55 AM

## 2015-05-17 NOTE — Transfer of Care (Signed)
Immediate Anesthesia Transfer of Care Note  Patient: Bonnie Barker  Procedure(s) Performed: Procedure(s) with comments: CATARACT EXTRACTION PHACO AND INTRAOCULAR LENS PLACEMENT (IOC) (Left) - [place] 01:00.5 AP% 17.8 CDE 10.76 fluid pack lot # [id number]  Patient Location: PACU  Anesthesia Type:MAC  Level of Consciousness: awake, alert  and oriented  Airway & Oxygen Therapy: Patient Spontanous Breathing  Post-op Assessment: Report given to RN and Post -op Vital signs reviewed and stable  Post vital signs: Reviewed and stable  Last Vitals:  Filed Vitals:   05/17/15 0955  BP: 180/77  Pulse: 100  Temp:   Resp: 18    Complications: No apparent anesthesia complications

## 2015-05-17 NOTE — Progress Notes (Signed)
Pt c/o "i'm getting hot, face flushed, vitals obtained, pt advises shrimp allegery - "causes a bad headache", used betadine gtt with surgery on other eye 2 weeks ago.  Dr Marcello Moores in to see and aware of same., will discuss with Dr George Ina when in to see

## 2015-05-17 NOTE — Anesthesia Postprocedure Evaluation (Signed)
  Anesthesia Post-op Note  Patient: Bonnie Barker  Procedure(s) Performed: Procedure(s) with comments: CATARACT EXTRACTION PHACO AND INTRAOCULAR LENS PLACEMENT (IOC) (Left) - [place] 01:00.5 AP% 17.8 CDE 10.76 fluid pack lot # [id number]  Anesthesia type:MAC  Patient location: PACU  Post pain: Pain level controlled  Post assessment: Post-op Vital signs reviewed, Patient's Cardiovascular Status Stable, Respiratory Function Stable, Patent Airway and No signs of Nausea or vomiting  Post vital signs: Reviewed and stable  Last Vitals:  Filed Vitals:   05/17/15 0955  BP: 180/77  Pulse: 100  Temp:   Resp: 18    Level of consciousness: awake, alert  and patient cooperative  Complications: No apparent anesthesia complications

## 2015-05-17 NOTE — Anesthesia Preprocedure Evaluation (Signed)
Anesthesia Evaluation  Patient identified by MRN, date of birth, ID band Patient awake    Reviewed: Allergy & Precautions, NPO status , Patient's Chart, lab work & pertinent test results, reviewed documented beta blocker date and time   Airway Mallampati: II  TM Distance: >3 FB     Dental  (+) Chipped   Pulmonary          Cardiovascular hypertension, + Peripheral Vascular Disease     Neuro/Psych Anxiety Depression  Neuromuscular disease    GI/Hepatic   Endo/Other  Hypothyroidism   Renal/GU      Musculoskeletal   Abdominal   Peds  Hematology   Anesthesia Other Findings   Reproductive/Obstetrics                             Anesthesia Physical Anesthesia Plan  ASA: III  Anesthesia Plan: MAC   Post-op Pain Management:    Induction:   Airway Management Planned:   Additional Equipment:   Intra-op Plan:   Post-operative Plan:   Informed Consent: I have reviewed the patients History and Physical, chart, labs and discussed the procedure including the risks, benefits and alternatives for the proposed anesthesia with the patient or authorized representative who has indicated his/her understanding and acceptance.     Plan Discussed with: CRNA  Anesthesia Plan Comments:         Anesthesia Quick Evaluation

## 2015-05-17 NOTE — Progress Notes (Signed)
Dr George Ina in to see pt 1020 am - aware of issue earlier with reddened face, pt seems relaxed and face less reddened upon departure to OR

## 2015-05-17 NOTE — Anesthesia Procedure Notes (Signed)
Procedure Name: MAC Date/Time: 05/17/2015 10:43 AM Performed by: Nelda Marseille Pre-anesthesia Checklist: Patient identified, Emergency Drugs available, Suction available, Patient being monitored and Timeout performed Oxygen Delivery Method: Nasal cannula

## 2015-05-17 NOTE — H&P (Signed)
  All labs reviewed. Abnormal studies sent to patients PCP when indicated.  Previous H&P reviewed, patient examined, there are NO CHANGES.  Bonnie Barker LOUIS7/12/201610:28 AM

## 2015-12-14 DIAGNOSIS — I872 Venous insufficiency (chronic) (peripheral): Secondary | ICD-10-CM | POA: Insufficient documentation

## 2015-12-19 IMAGING — US US SOFT TISSUE HEAD/NECK
1 series · 14 of 25 positions shown · non-contrast
Comparison: None.

CLINICAL DATA: Right cervical palpable lesion x1 year.

EXAM:
ULTRASOUND OF HEAD/NECK SOFT TISSUES
TECHNIQUE: Ultrasound examination of the head and neck soft tissues was
performed in the area of clinical concern.

[Series 1: us soft tissue head/neck · 0.07mm/px · 14 of 37 slices shown]
[im 1/37]
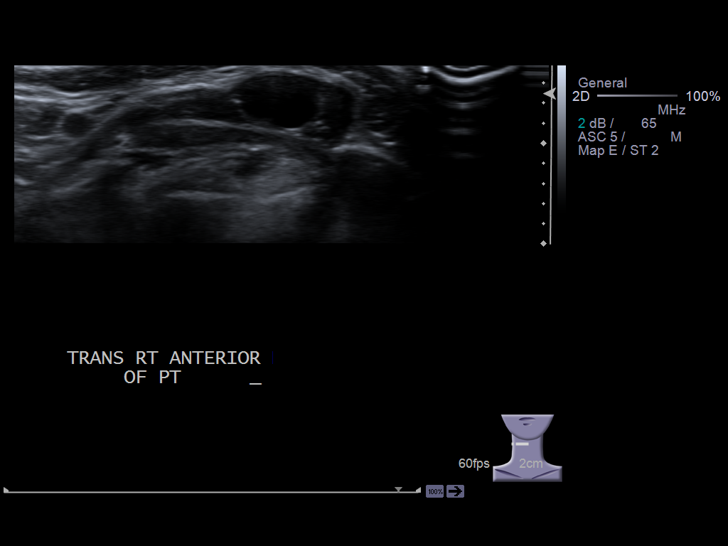
[im 4/37]
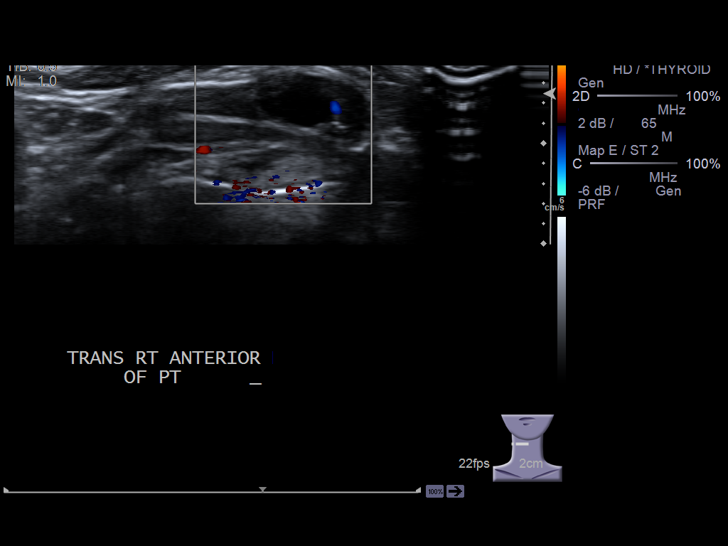
[im 7/37]
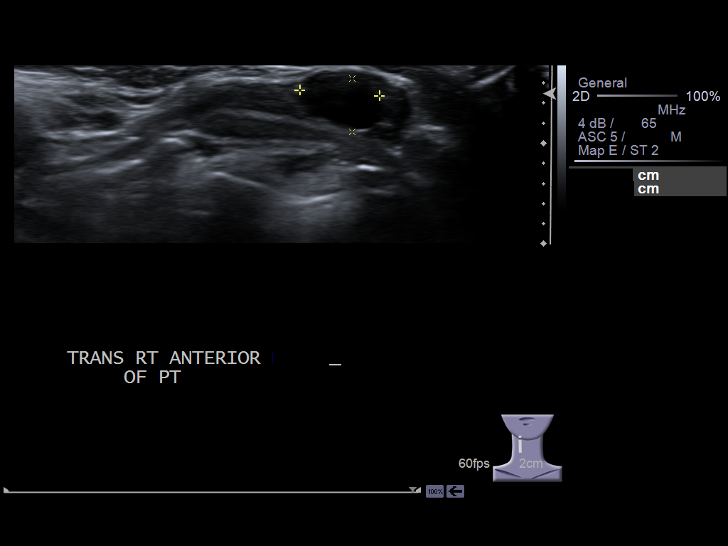
[im 10/37]
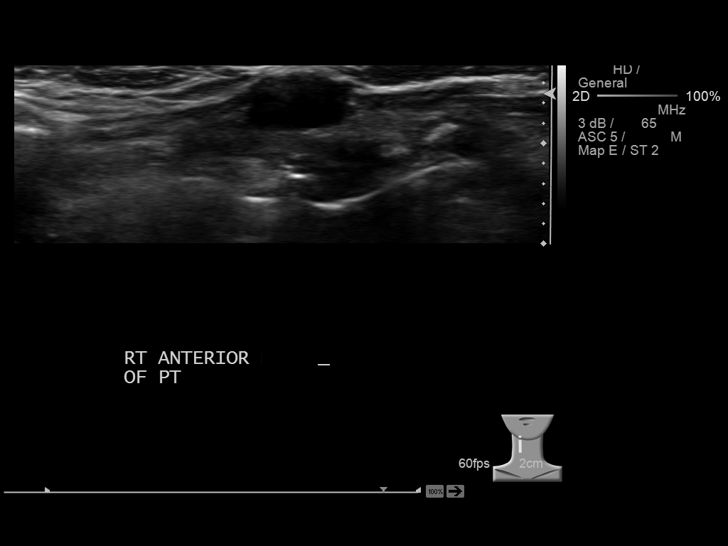
[im 13/37]
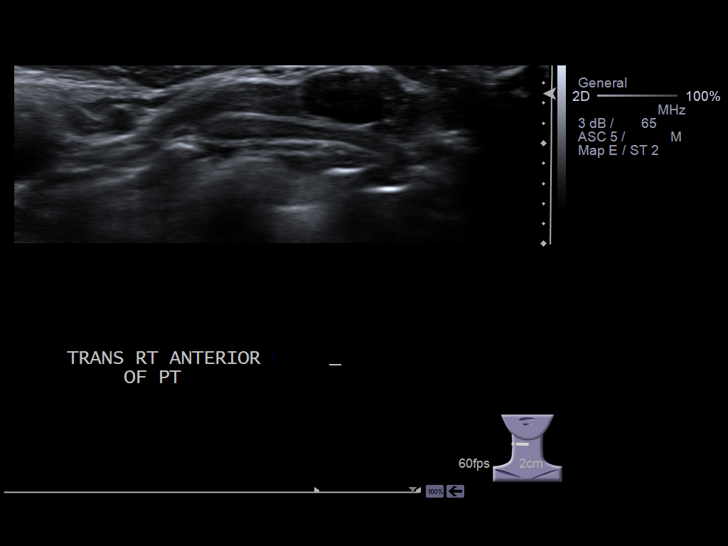
[im 14/37]
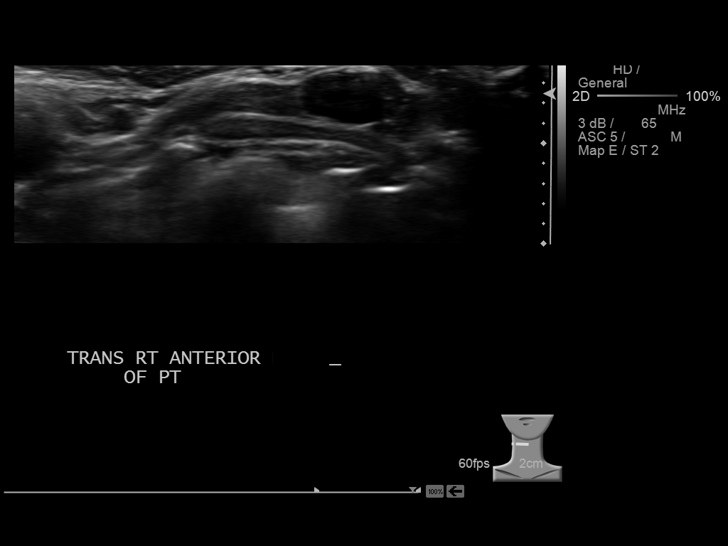
[im 17/37]
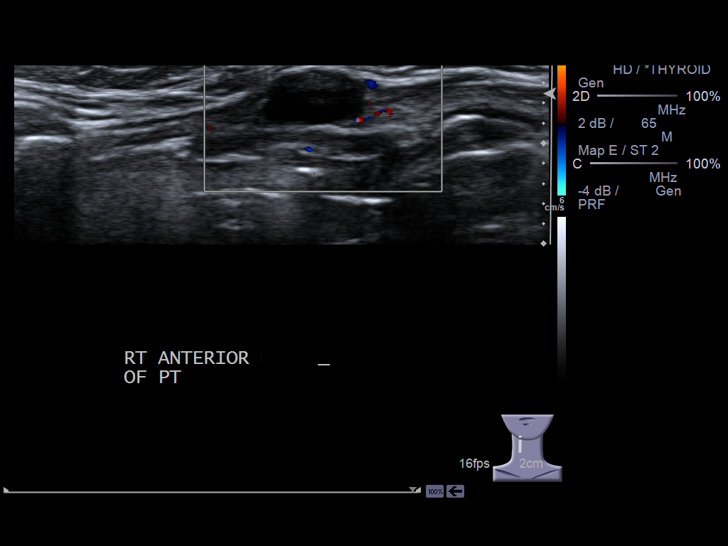
[im 20/37]
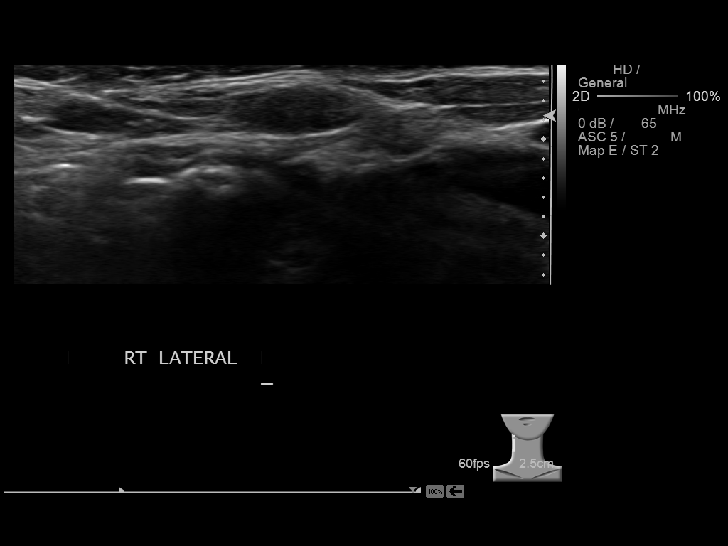
[im 23/37]
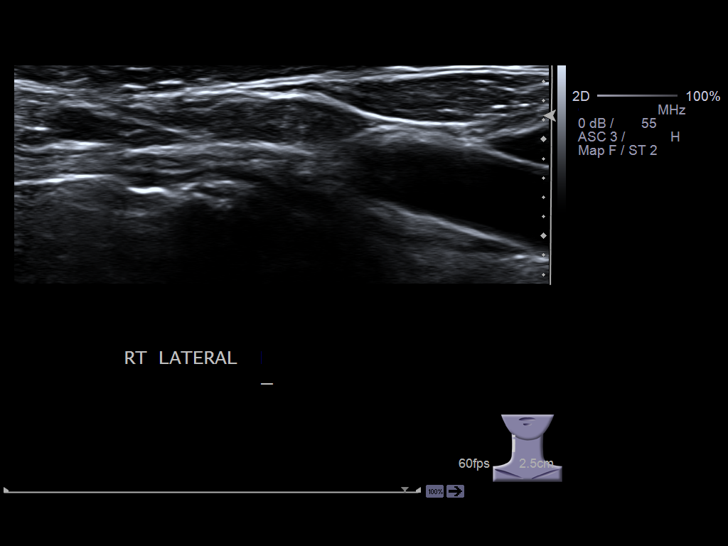
[im 25/37]
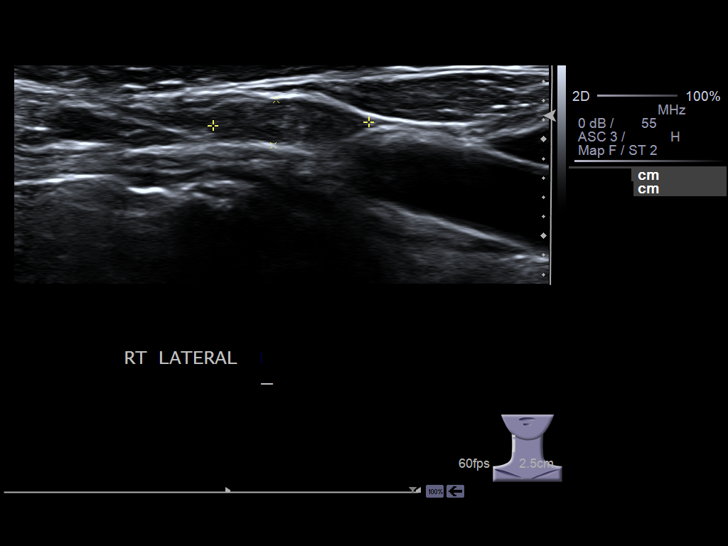
[im 28/37]
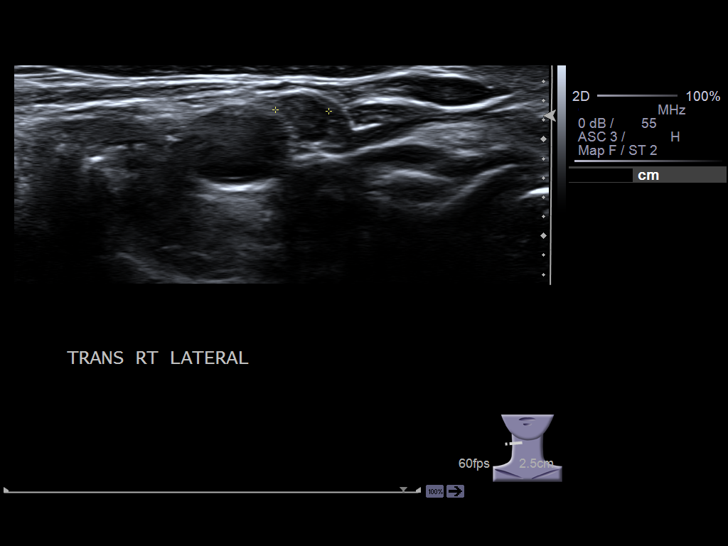
[im 31/37]
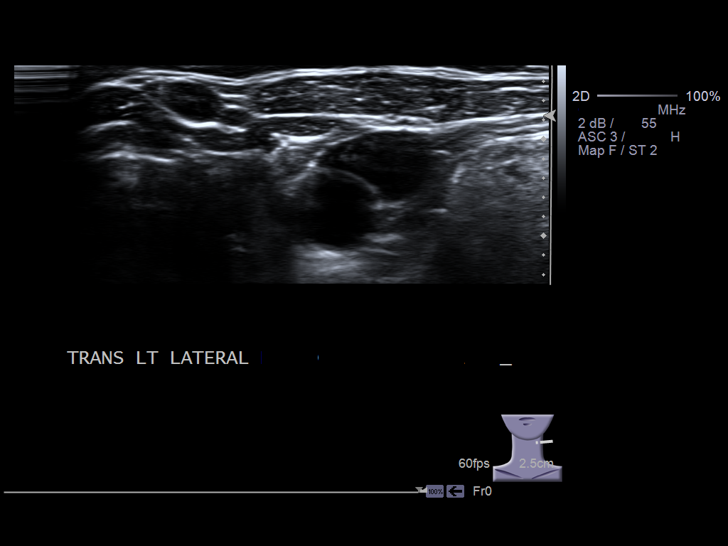
[im 34/37]
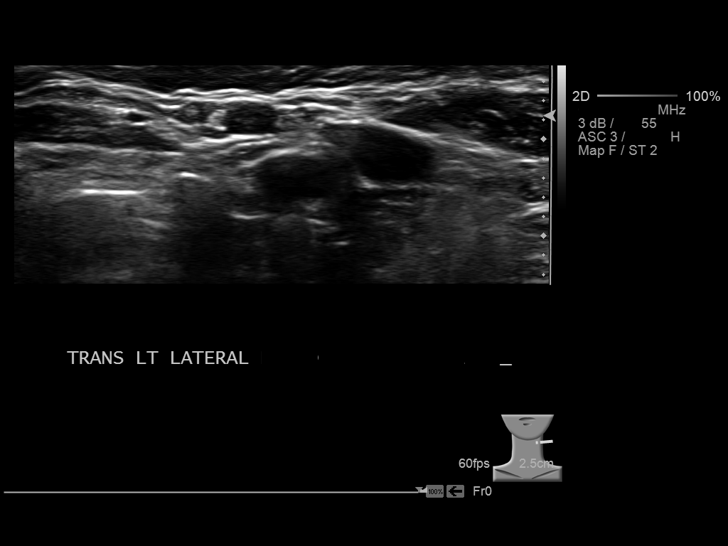
[im 37/37]
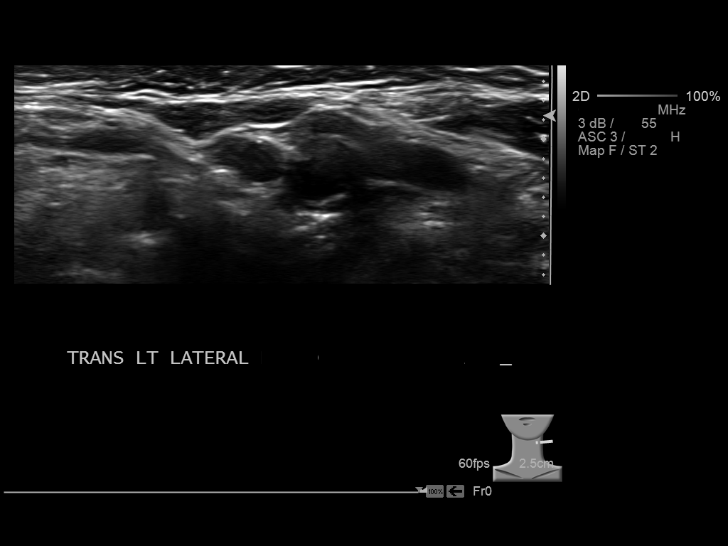

[14 of 25 positions shown; findings below may reference images not displayed]

FINDINGS: 8 x 5 x 10 mm subcutaneous cystic lesion in the right anterior neck
corresponding to palpable abnormality. Some peripheral flow signal
on color Doppler. Regional cervical lymph nodes are noted measuring
no more than 5 mm short axis diameter.
IMPRESSION: 1. 10 mm complex cyst corresponding to palpable region, nonspecific.

## 2016-07-29 ENCOUNTER — Encounter: Payer: Self-pay | Admitting: Emergency Medicine

## 2016-07-29 ENCOUNTER — Emergency Department
Admission: EM | Admit: 2016-07-29 | Discharge: 2016-07-29 | Disposition: A | Discharge status: Home / Self Care | Payer: Medicare Other | Attending: Student | Admitting: Student

## 2016-07-29 ENCOUNTER — Emergency Department: Payer: Medicare Other

## 2016-07-29 DIAGNOSIS — Z7982 Long term (current) use of aspirin: Secondary | ICD-10-CM | POA: Insufficient documentation

## 2016-07-29 DIAGNOSIS — Z853 Personal history of malignant neoplasm of breast: Secondary | ICD-10-CM | POA: Insufficient documentation

## 2016-07-29 DIAGNOSIS — Z79899 Other long term (current) drug therapy: Secondary | ICD-10-CM | POA: Insufficient documentation

## 2016-07-29 DIAGNOSIS — R531 Weakness: Secondary | ICD-10-CM

## 2016-07-29 DIAGNOSIS — Z791 Long term (current) use of non-steroidal anti-inflammatories (NSAID): Secondary | ICD-10-CM | POA: Insufficient documentation

## 2016-07-29 DIAGNOSIS — E039 Hypothyroidism, unspecified: Secondary | ICD-10-CM | POA: Insufficient documentation

## 2016-07-29 DIAGNOSIS — F419 Anxiety disorder, unspecified: Secondary | ICD-10-CM | POA: Insufficient documentation

## 2016-07-29 DIAGNOSIS — I10 Essential (primary) hypertension: Secondary | ICD-10-CM | POA: Insufficient documentation

## 2016-07-29 LAB — URINALYSIS COMPLETE WITH MICROSCOPIC (ARMC ONLY)
BACTERIA UA: NONE SEEN
Bilirubin Urine: NEGATIVE
GLUCOSE, UA: NEGATIVE mg/dL
HGB URINE DIPSTICK: NEGATIVE
Leukocytes, UA: NEGATIVE
Nitrite: NEGATIVE
PROTEIN: NEGATIVE mg/dL
SPECIFIC GRAVITY, URINE: 1.006 (ref 1.005–1.030)
Squamous Epithelial / LPF: NONE SEEN
WBC UA: NONE SEEN WBC/hpf (ref 0–5)
pH: 8 (ref 5.0–8.0)

## 2016-07-29 LAB — CBC WITH DIFFERENTIAL/PLATELET
BASOS PCT: 1 %
Basophils Absolute: 0.1 10*3/uL (ref 0–0.1)
Eosinophils Absolute: 0.1 10*3/uL (ref 0–0.7)
Eosinophils Relative: 1 %
HCT: 41.1 % (ref 35.0–47.0)
Hemoglobin: 13.8 g/dL (ref 12.0–16.0)
LYMPHS PCT: 13 %
Lymphs Abs: 1.8 10*3/uL (ref 1.0–3.6)
MCH: 29.8 pg (ref 26.0–34.0)
MCHC: 33.6 g/dL (ref 32.0–36.0)
MCV: 88.7 fL (ref 80.0–100.0)
MONO ABS: 1.3 10*3/uL — AB (ref 0.2–0.9)
MONOS PCT: 10 %
NEUTROS ABS: 10 10*3/uL — AB (ref 1.4–6.5)
NEUTROS PCT: 75 %
PLATELETS: 286 10*3/uL (ref 150–440)
RBC: 4.63 MIL/uL (ref 3.80–5.20)
RDW: 13 % (ref 11.5–14.5)
WBC: 13.2 10*3/uL — ABNORMAL HIGH (ref 3.6–11.0)

## 2016-07-29 LAB — COMPREHENSIVE METABOLIC PANEL
ALBUMIN: 4.3 g/dL (ref 3.5–5.0)
ALK PHOS: 71 U/L (ref 38–126)
ALT: 18 U/L (ref 14–54)
AST: 25 U/L (ref 15–41)
Anion gap: 9 (ref 5–15)
BUN: 20 mg/dL (ref 6–20)
CALCIUM: 9.9 mg/dL (ref 8.9–10.3)
CHLORIDE: 100 mmol/L — AB (ref 101–111)
CO2: 25 mmol/L (ref 22–32)
CREATININE: 0.9 mg/dL (ref 0.44–1.00)
GFR calc non Af Amer: 57 mL/min — ABNORMAL LOW (ref >60)
GLUCOSE: 82 mg/dL (ref 65–99)
Potassium: 3.9 mmol/L (ref 3.5–5.1)
SODIUM: 134 mmol/L — AB (ref 135–145)
Total Bilirubin: 0.8 mg/dL (ref 0.3–1.2)
Total Protein: 7.4 g/dL (ref 6.5–8.1)

## 2016-07-29 LAB — TROPONIN I: Troponin I: <0.03 ng/mL (ref <0.03)

## 2016-07-29 LAB — TSH: TSH: 3.293 u[IU]/mL (ref 0.350–4.500)

## 2016-07-29 LAB — T4, FREE: FREE T4: 0.96 ng/dL (ref 0.61–1.12)

## 2016-07-29 MED ORDER — LORAZEPAM 0.5 MG PO TABS
0.2500 mg | ORAL_TABLET | Freq: Once | ORAL | Status: AC
  Administered 2016-07-29: 0.25 mg via ORAL
  Filled 2016-07-29: qty 1

## 2016-07-29 NOTE — ED Provider Notes (Addendum)
[facility] Emergency Department Provider Note   ____________________________________________   First MD Initiated Contact with Patient 07/29/16 1116     (approximate)  I have reviewed the triage vital signs and the nursing notes.   HISTORY  Chief Complaint Fatigue and Anxiety    HPI Bonnie Barker is a 82 y.o. female with history of anxiety, depression, hypertension, hypothyroidism, trigeminal neuralgia who presents for evaluation of episodes of generalized weakness since yesterday, usually gradual onset, currently moderate, no modifying factors. Patient reports that last night she had an episode where she suddenly felt a wave of weakness such that she had difficulty moving her arms and her legs. She reports she went to bed and woke up this morning feeling well without any weakness. While she was fixing her hair to go to church and after she had talked to her son (who has cancer and she is his caregiver) and who was telling her that he was going to leave the house to live with his girlfriend, she began feeling a wave of weakness again and she felt very lightheaded. She called EMS. She denies any chest pain or difficulty breathing. No vomiting, diarrhea, fevers or chills. No pain or burning with urination. She reports she had these episodes many years ago and they were related to "stress". She denies any SI, HI or audiovisual hallucinations.   Past Medical History:  Diagnosis Date  . Anxiety   . Breast cancer ([place])   . Bronchitis   . Carotid stenosis    bilateral  . Chronic cough   . Depression   . Diverticulitis   . Diverticulosis   . Environmental allergies   . HOH (hard of hearing)   . Hypercholesteremia   . Hypertension   . Hypothyroidism   . Neuropathy ([place])   . Osteoporosis   . Palpitations   . Sinus trouble   . Tachycardia   . Trigeminal neuralgia     There are no active problems to display for this patient.   Past Surgical History:   Procedure Laterality Date  . ABDOMINAL HYSTERECTOMY    . CATARACT EXTRACTION W/PHACO Right 05/03/2015   Procedure: CATARACT EXTRACTION PHACO AND INTRAOCULAR LENS PLACEMENT (IOC);  Surgeon: Birder Robson, MD;  Location: [facility] ORS;  Service: Ophthalmology;  Laterality: Right;  [place] 00:54 AP% 18.8 CDE 10.21 fluid pack lot # [id number]  . CATARACT EXTRACTION W/PHACO Left 05/17/2015   Procedure: CATARACT EXTRACTION PHACO AND INTRAOCULAR LENS PLACEMENT (IOC);  Surgeon: Birder Robson, MD;  Location: [facility] ORS;  Service: Ophthalmology;  Laterality: Left;  [place] 01:00.5 AP% 17.8 CDE 10.76 fluid pack lot # [id number]  . MASTECTOMY    . THYROIDECTOMY, PARTIAL      Prior to Admission medications   Medication Sig Start Date End Date Taking? Authorizing Provider  amLODipine (NORVASC) 5 MG tablet Take 5 mg by mouth daily.    Historical Provider, MD  aspirin 81 MG tablet Take 81 mg by mouth daily.    Historical Provider, MD  calcium citrate-vitamin D (CITRACAL+D) 315-200 MG-UNIT per tablet Take 1 tablet by mouth 2 (two) times daily.    Historical Provider, MD  levothyroxine (SYNTHROID, LEVOTHROID) 75 MCG tablet Take 75 mcg by mouth daily before breakfast.    Historical Provider, MD  loratadine (CLARITIN) 10 MG tablet Take 10 mg by mouth daily.    Historical Provider, MD  Multiple Vitamin (MULTIVITAMIN) tablet Take 1 tablet by mouth daily.    Historical Provider, MD  naproxen sodium (ANAPROX)  220 MG tablet Take 220 mg by mouth 2 (two) times daily as needed.    Historical Provider, MD  omeprazole (PRILOSEC) 10 MG capsule Take 10 mg by mouth daily.    Historical Provider, MD  Vit C-Cholecalciferol-Rose Hip (VITAMIN C & D3/ROSE HIPS) [id number] MG-UNIT-MG CAPS Take by mouth.    Historical Provider, MD    Allergies Acthib [haemophilus b polysacc tetanus toxoid conj vaccine]; Apap [acetaminophen]; Banana concentrate [banana]; Benadryl [diphenhydramine hcl (sleep)]; Bentyl [dicyclomine hcl]; Biaxin  [clarithromycin]; Codeine; Eryc [erythromycin]; Etodolac; Flagyl [metronidazole]; Fosamax [alendronate sodium]; Iodine; Penicillins; Statins; Tetracyclines & related; Tramadol; and Vibramycin [doxycycline calcium]  History reviewed. No pertinent family history.  Social History Social History  Substance Use Topics  . Smoking status: Never Smoker  . Smokeless tobacco: Not on file  . Alcohol use No    Review of Systems Constitutional: No fever/chills Eyes: No visual changes. ENT: No sore throat. Cardiovascular: Denies chest pain. Respiratory: Denies shortness of breath. Gastrointestinal: No abdominal pain.  No nausea, no vomiting.  No diarrhea.  No constipation. Genitourinary: Negative for dysuria. Musculoskeletal: Negative for back pain. Skin: Negative for rash. Neurological: Negative for headaches, focal weakness or numbness.  10-point ROS otherwise negative.  ____________________________________________   PHYSICAL EXAM:  Vitals:   07/29/16 1118 07/29/16 1119 07/29/16 1431  BP: (!) 158/76  (!) 160/73  Pulse: 74  73  Resp: 16  16  Temp: 98.3 F (36.8 C)  98.3 F (36.8 C)  TempSrc: Oral    SpO2: 95%  96%  Weight:  135 lb (61.2 kg)   Height:  5\' 3"  (1.6 m)     VITAL SIGNS: ED Triage Vitals  Enc Vitals Group     BP      Pulse      Resp      Temp      Temp src      SpO2      Weight      Height      Head Circumference      Peak Flow      Pain Score      Pain Loc      Pain Edu?      Excl. in [place]?     Constitutional: Alert and oriented. Nontoxic-appearing but she does appear anxious. Eyes: Conjunctivae are normal. PERRL. EOMI. Head: Atraumatic. Nose: No congestion/rhinnorhea. Mouth/Throat: Mucous membranes are moist.  Oropharynx non-erythematous. Neck: No stridor.   Cardiovascular: Normal rate, regular rhythm. Grossly normal heart sounds.  Good peripheral circulation. Respiratory: Normal respiratory effort.  No retractions. Lungs CTAB. Gastrointestinal:  Soft and nontender. No distention. No CVA tenderness. Genitourinary: deferred Musculoskeletal: No lower extremity tenderness nor edema.  No joint effusions. Neurologic:  Normal speech and language. No gross focal neurologic deficits are appreciated. 5 out of 5 strength in bilateral upper and lower extremities, sensation intact to light touch throughout. Skin:  Skin is warm, dry and intact. No rash noted. Psychiatric: Mood is anxious, affect is normal. Speech is normal, mild psychomotor agitation.  ____________________________________________   LABS (all labs ordered are listed, but only abnormal results are displayed)  Labs Reviewed  CBC WITH DIFFERENTIAL/PLATELET - Abnormal; Notable for the following:       Result Value   WBC 13.2 (*)    Neutro Abs 10.0 (*)    Monocytes Absolute 1.3 (*)    All other components within normal limits  COMPREHENSIVE METABOLIC PANEL - Abnormal; Notable for the following:    Sodium 134 (*)  Chloride 100 (*)    GFR calc non Af Amer 57 (*)    All other components within normal limits  URINALYSIS COMPLETEWITH MICROSCOPIC ([facility] ONLY) - Abnormal; Notable for the following:    Color, Urine STRAW (*)    APPearance CLEAR (*)    Ketones, ur TRACE (*)    All other components within normal limits  TROPONIN I  TSH  T4, FREE   ____________________________________________  EKG  ED ECG REPORT I, Joanne Gavel, the attending physician, personally viewed and interpreted this ECG.   Date: 07/29/2016  EKG Time: 11:25  Rate: 70  Rhythm: normal sinus rhythm  Axis: normal  Intervals:none  ST&T Change: No acute ST elevation or acute ST depression.  ____________________________________________  RADIOLOGY  CXR IMPRESSION:  No acute cardiopulmonary disease.    Aortic atherosclerosis.     ____________________________________________   PROCEDURES  Procedure(s) performed: None  Procedures  Critical Care performed:  No  ____________________________________________   INITIAL IMPRESSION / ASSESSMENT AND PLAN / ED COURSE  Pertinent labs & imaging results that were available during my care of the patient were reviewed by me and considered in my medical decision making (see chart for details).  Bonnie Barker is a 82 y.o. female with history of anxiety, depression, hypertension, hypothyroidism, trigeminal neuralgia who presents for evaluation of episodes of generalized weakness since yesterday, usually gradual onset, currently moderate, no modifying factors. On exam, she is nontoxic-appearing but she does appear anxious. Her vital signs are stable, she is afebrile. She has an intact neurological examination and a generally benign physical examination. She has no objective weakness. I suspect her symptoms today may be anxiety related/this may be a stress reaction given that she has had these issues in the setting of stress and given her multiple recent stressors. however we'll obtain screening labs, chest x-ray, urinalysis, treat her symptomatically with 0.25 mg of by mouth Ativan and reassess her disposition.  ----------------------------------------- 2:23 PM on 07/29/2016 ----------------------------------------- Patient reports she feels better after Ativan. She is sleeping but awakens to voice and light touch and seems to be in good spirits. Her workup was unremarkable with the exception of mild nonspecific leukocytosis with this white blood cell count of 13.2. Unremarkable CMP, negative troponin. Normal thyroid studies. Urinalysis is not consistent with infection. Chest x-ray clear. We discussed return precautions and need for close PCP follow-up and she is comfortable with the discharge plan. DC home.  Clinical Course     ____________________________________________   FINAL CLINICAL IMPRESSION(S) / ED DIAGNOSES  Final diagnoses:  Anxiety  Weakness      NEW MEDICATIONS STARTED DURING THIS  VISIT:  New Prescriptions   No medications on file     Note:  This document was prepared using Dragon voice recognition software and may include unintentional dictation errors.    Joanne Gavel, MD 07/29/16 1430    Joanne Gavel, MD 07/29/16 [phone]

## 2016-07-29 NOTE — ED Notes (Signed)
Patient ambulated without assistance.  Reports no dizziness or loss of balance.  Patient dressed herself and is ready for discharge.

## 2016-07-29 NOTE — ED Triage Notes (Signed)
Pt states that she has been having spells that began on yesterday.  She states that she got up to get ready for church this am and became weak and had a funny feeling "like she is lifeless".  She states that she becomes warm and lightheaded when the episode is about to occur.  She denies having a fever.  Pt has had a double mastectomy.  Pt states that she has been under stress lately.

## 2016-07-29 NOTE — ED Notes (Signed)
Patient transported to X-ray 

## 2016-10-25 ENCOUNTER — Other Ambulatory Visit: Payer: Self-pay | Admitting: Student

## 2016-10-25 DIAGNOSIS — G8929 Other chronic pain: Secondary | ICD-10-CM

## 2016-10-25 DIAGNOSIS — M5442 Lumbago with sciatica, left side: Principal | ICD-10-CM

## 2016-11-07 ENCOUNTER — Ambulatory Visit: Payer: PRIVATE HEALTH INSURANCE

## 2017-02-15 ENCOUNTER — Other Ambulatory Visit: Payer: Self-pay | Admitting: Otolaryngology

## 2017-02-15 DIAGNOSIS — E041 Nontoxic single thyroid nodule: Secondary | ICD-10-CM

## 2017-02-20 ENCOUNTER — Ambulatory Visit
Admission: RE | Admit: 2017-02-20 | Discharge: 2017-02-20 | Disposition: A | Discharge status: Home / Self Care | Payer: Medicare Other | Source: Ambulatory Visit | Attending: Otolaryngology | Admitting: Otolaryngology

## 2017-02-20 DIAGNOSIS — E041 Nontoxic single thyroid nodule: Secondary | ICD-10-CM | POA: Insufficient documentation

## 2017-04-10 ENCOUNTER — Ambulatory Visit: Payer: Medicare Other | Attending: Otolaryngology | Admitting: Speech Pathology

## 2017-04-10 ENCOUNTER — Encounter: Payer: Self-pay | Admitting: Speech Pathology

## 2017-04-10 DIAGNOSIS — R490 Dysphonia: Secondary | ICD-10-CM | POA: Diagnosis not present

## 2017-04-10 NOTE — Therapy (Signed)
[place] MAIN [place] SERVICES [address] [place], [place], [address] Phone: [phone]   Fax:  [phone]  Speech Language Pathology Evaluation  Patient Details  Name: Bonnie Barker MRN: [id number] Date of Birth: 1929-05-25 Referring Provider: Bud Face   Encounter Date: 04/10/2017      End of Session - 04/10/17 1522    Visit Number 1   Number of Visits 17   Date for SLP Re-Evaluation 06/10/17   SLP Start Time 1100   SLP Stop Time  1153   SLP Time Calculation (min) 53 min   Activity Tolerance Patient tolerated treatment well      Past Medical History:  Diagnosis Date  . Anxiety   . Breast cancer ([facility])   . Bronchitis   . Carotid stenosis    bilateral  . Chronic cough   . Depression   . Diverticulitis   . Diverticulosis   . Environmental allergies   . HOH (hard of hearing)   . Hypercholesteremia   . Hypertension   . Hypothyroidism   . Neuropathy   . Osteoporosis   . Palpitations   . Sinus trouble   . Tachycardia   . Trigeminal neuralgia     Past Surgical History:  Procedure Laterality Date  . ABDOMINAL HYSTERECTOMY    . CATARACT EXTRACTION W/PHACO Right 05/03/2015   Procedure: CATARACT EXTRACTION PHACO AND INTRAOCULAR LENS PLACEMENT (IOC);  Surgeon: Galen Manila, MD;  Location: [facility] ORS;  Service: Ophthalmology;  Laterality: Right;  [place] 00:54 AP% 18.8 CDE 10.21 fluid pack lot # [id number]  . CATARACT EXTRACTION W/PHACO Left 05/17/2015   Procedure: CATARACT EXTRACTION PHACO AND INTRAOCULAR LENS PLACEMENT (IOC);  Surgeon: Galen Manila, MD;  Location: [facility] ORS;  Service: Ophthalmology;  Laterality: Left;  [place] 01:00.5 AP% 17.8 CDE 10.76 fluid pack lot # [id number]  . MASTECTOMY    . THYROIDECTOMY, PARTIAL      There were no vitals filed for this visit.          SLP Evaluation [facility] - 04/10/17 0001      SLP Visit Information   SLP Received On 04/10/17   Referring Provider Andee Poles, [place]    Onset Date 04/02/2017   Medical Diagnosis Dysphonia     Subjective   Subjective  "I don't want surgery- I'll try speech therapy for a month"   Patient/Family Stated Goal Clear vocal quality     General Information   HPI 83 year old woman referred by Dr. Andee Poles for voice therapy.  Per report, abnormal laryngeal findings include: moderate glottal gap and mild bilateral bowing of the vocal cords.      Prior Functional Status   Cognitive/Linguistic Baseline Within functional limits     Oral Motor/Sensory Function   Overall Oral Motor/Sensory Function Impaired   Overall Oral Motor/Sensory Function History of trigeminal neualgia- numbness on right     Motor Speech   Overall Motor Speech Impaired   Respiration Impaired   Level of Impairment Conversation   Phonation Breathy;Hoarse   Resonance Within functional limits   Articulation Within functional limitis   Intelligibility Intelligible   Phonation Impaired   Vocal Abuses Glottal Attack;Vocal Fold Dehydration   Tension Present Jaw;Neck;Shoulder   Volume Appropriate   Pitch Low     Standardized Assessments   Standardized Assessments  Other Assessment  Perceptual Voice Evaluation      Perceptual Voice Evaluation Voice checklist: . Health risks: GERD, allergies  . Characteristic voice use: patient states she talks  .  Environmental risks: no significant environmental risks . Misuse: excessive talking, speaking with tension, glottal fry . Abuse: some coughing/throat clearing . Vocal characteristics: breathy, hoarse, limited voice range, poor vocal projection, excessive pharyngeal resonance Maximum phonation time for sustained "ah": 13 seconds Average fundamental frequency during sustained "ah": 177 Hz (2.4 STD below average for age and gender) Average time patient was able to sustain /s/: 10 seconds Average time patient was able to sustain /z/: 3 seconds- patient did not fully understand task s/z ratio : N/A Visi-Pitch:  Multi-Dimensional Voice Program (MDVP)  MDVPT extracts objective quantitative values (Relative Average Perturbation, Shimmer, Voice Turbulence Index, and Noise to Harmonic Ratio) on sustained phonation, which are displayed graphically and numerically in comparison to a built-in normative database.  The patient exhibited values outside the norm for Relative Average Perturbation, Shimmer, and Noise to Harmonic Ratio.  Average fundamental frequency was 2.4 STD below average for age and gender. Perceptually, her voice was muffled. Patient improved all parameters with cue ("Loud like me"). Education: Patient instructed in extrinsic laryngeal muscle stretches and breath support exercises      SLP Education - 04/10/17 1522    Education provided Yes   Education Details Goals of voice therapy   Person(s) Educated Patient   Methods Explanation   Comprehension Verbalized understanding            SLP Long Term Goals - 04/10/17 1529      SLP LONG TERM GOAL #1   Title The patient will demonstrate independent understanding of vocal hygiene concepts and extrinsic laryngeal muscle stretches.     Time 8   Period Weeks   Status New     SLP LONG TERM GOAL #2   Title The patient will be independent for abdominal breathing and breath support exercises.   Time 8   Period Weeks   Status New     SLP LONG TERM GOAL #3   Title The patient will maximize voice quality and loudness using breath support/oral resonance for sustained vowel production, pitch glides, and hierarchal speech drill.   Time 8   Period Weeks   Status New     SLP LONG TERM GOAL #4   Title The patient will maximize voice quality and loudness using breath support/oral resonance for paragraph length recitation with 80% accuracy.   Time 8   Period Weeks   Status New          Plan - 04/10/17 1523    Clinical Impression Statement This 83 year woman under the care of Dr. Andee Poles, with moderate glottal gap and mild bowed vocal cords,  is presenting with moderate dysphonia characterized by hoarse vocal quality, reduced breath control for speech, reduced pitch range, and intrinsic/extrinsic laryngeal tension.  The patient will benefit from voice therapy for education (vocal hygiene, reflux precautions), to improve breath control for speech, reduce laryngeal tension, and relaxed phonation / oral resonance.  The patient was able to improve vocal quality with improved breath support / vocal loudness.   Speech Therapy Frequency 2x / week   Duration Other (comment)  8 weeks   Treatment/Interventions SLP instruction and feedback;Patient/family education;Other (comment)  Voice therapy   Potential to Achieve Goals Good   Potential Considerations Ability to learn/carryover information;Co-morbidities;Cooperation/participation level;Medical prognosis;Previous level of function;Severity of impairments;Family/community support   SLP Home Exercise Plan extrinsic laryngeal muscle stretches and breath support exercises   Consulted and Agree with Plan of Care Patient      Patient will benefit from skilled therapeutic  intervention in order to improve the following deficits and impairments:   Dysphonia - Plan: SLP plan of care cert/re-cert      G-Codes - 05-05-2017 1531    Functional Assessment Tool Used Perceptual Voice Evaluation, clinical judgment   Functional Limitations Voice   Voice Current Status (G9171) At least 40 percent but less than 60 percent impaired, limited or restricted   Voice Goal Status (Z6109) At least 1 percent but less than 20 percent impaired, limited or restricted      Problem List There are no active problems to display for this patient.  Dollene Primrose, MS/CCC- SLP  Leandrew Koyanagi 2017/05/05, 3:34 PM  [place] [facility] MAIN [facility] SERVICES [address] [place], [place], [address] Phone: [phone]   Fax:  [phone]  Name: Bonnie Barker MRN: [id number] Date of  Birth: 1928-12-07

## 2017-04-15 ENCOUNTER — Encounter: Payer: Self-pay | Admitting: Speech Pathology

## 2017-04-15 ENCOUNTER — Ambulatory Visit: Payer: Medicare Other | Admitting: Speech Pathology

## 2017-04-15 DIAGNOSIS — R490 Dysphonia: Secondary | ICD-10-CM | POA: Diagnosis not present

## 2017-04-15 NOTE — Therapy (Signed)
[place] MAIN [facility] SERVICES [address] [place], [place], [address] Phone: [phone]   Fax:  [phone]  Speech Language Pathology Treatment  Patient Details  Name: Bonnie Barker MRN: [id number] Date of Birth: [birth year] Referring Provider: Carloyn Manner   Encounter Date: 04/15/2017      End of Session - 04/15/17 1235    Visit Number 2   Number of Visits 17   Date for SLP Re-Evaluation 06/10/17   SLP Start Time 1100   SLP Stop Time  1145   SLP Time Calculation (min) 45 min   Activity Tolerance Patient tolerated treatment well      Past Medical History:  Diagnosis Date  . Anxiety   . Breast cancer ([place])   . Bronchitis   . Carotid stenosis    bilateral  . Chronic cough   . Depression   . Diverticulitis   . Diverticulosis   . Environmental allergies   . HOH (hard of hearing)   . Hypercholesteremia   . Hypertension   . Hypothyroidism   . Neuropathy   . Osteoporosis   . Palpitations   . Sinus trouble   . Tachycardia   . Trigeminal neuralgia     Past Surgical History:  Procedure Laterality Date  . ABDOMINAL HYSTERECTOMY    . CATARACT EXTRACTION W/PHACO Right 05/03/2015   Procedure: CATARACT EXTRACTION PHACO AND INTRAOCULAR LENS PLACEMENT (IOC);  Surgeon: Birder Robson, MD;  Location: [facility] ORS;  Service: Ophthalmology;  Laterality: Right;  [place] 00:54 AP% 18.8 CDE 10.21 fluid pack lot # [id number]  . CATARACT EXTRACTION W/PHACO Left 05/17/2015   Procedure: CATARACT EXTRACTION PHACO AND INTRAOCULAR LENS PLACEMENT (IOC);  Surgeon: Birder Robson, MD;  Location: [facility] ORS;  Service: Ophthalmology;  Laterality: Left;  [place] 01:00.5 AP% 17.8 CDE 10.76 fluid pack lot # [id number]  . MASTECTOMY    . THYROIDECTOMY, PARTIAL      There were no vitals filed for this visit.      Subjective Assessment - 04/15/17 1234    Subjective "I lose my voice if I get really tired."   Currently in Pain? No/denies                ADULT SLP TREATMENT - 04/15/17 0001      General Information   Behavior/Cognition Alert;Cooperative;Pleasant mood   HPI 83 year old woman referred by Dr. Pryor Ochoa for voice therapy.  Per report, abnormal laryngeal findings include: moderate glottal gap and mild bilateral bowing of the vocal cords.      Treatment Provided   Treatment provided Cognitive-Linquistic     Pain Assessment   Pain Assessment No/denies pain     Cognitive-Linquistic Treatment   Treatment focused on Voice   Skilled Treatment The patient was provided with written and verbal teaching regarding neck, shoulder, tongue, and throat stretches exercises to promote relaxed phonation. The patient was provided with written and verbal teaching regarding breath support exercises.  Patient instructed in vocal loudness. Maintains good vocal quality, using vocal loudness, in recitation and reading with 75% accuracy.       Assessment / Recommendations / Plan   Plan Continue with current plan of care     Progression Toward Goals   Progression toward goals Progressing toward goals          SLP Education - 04/15/17 1234    Education provided Yes   Education Details Voice building   Person(s) Educated Patient   Methods Explanation   Comprehension Verbalized  understanding            SLP Long Term Goals - 04/10/17 1529      SLP LONG TERM GOAL #1   Title The patient will demonstrate independent understanding of vocal hygiene concepts and extrinsic laryngeal muscle stretches.     Time 8   Period Weeks   Status New     SLP LONG TERM GOAL #2   Title The patient will be independent for abdominal breathing and breath support exercises.   Time 8   Period Weeks   Status New     SLP LONG TERM GOAL #3   Title The patient will maximize voice quality and loudness using breath support/oral resonance for sustained vowel production, pitch glides, and hierarchal speech drill.   Time 8   Period Weeks    Status New     SLP LONG TERM GOAL #4   Title The patient will maximize voice quality and loudness using breath support/oral resonance for paragraph length recitation with 80% accuracy.   Time 8   Period Weeks   Status New          Plan - 04/15/17 1235    Clinical Impression Statement  Patient able to improve vocal quality and decrease laryngeal strain using vocal loudness.   Speech Therapy Frequency 2x / week   Duration Other (comment)   Treatment/Interventions SLP instruction and feedback;Patient/family education;Other (comment)  Voice therapy   Potential to Achieve Goals Good   Potential Considerations Ability to learn/carryover information;Co-morbidities;Cooperation/participation level;Medical prognosis;Previous level of function;Severity of impairments;Family/community support   SLP Home Exercise Plan extrinsic laryngeal muscle stretches,  breath support exercises, voice buidling   Consulted and Agree with Plan of Care Patient      Patient will benefit from skilled therapeutic intervention in order to improve the following deficits and impairments:   Dysphonia    Problem List There are no active problems to display for this patient.  Leroy Sea, MS/CCC- SLP  Lou Miner 04/15/2017, 12:36 PM  [place] MAIN [facility] SERVICES [address] [place], [place], [address] Phone: [phone]   Fax:  [phone]   Name: LANITRA BATTAGLINI MRN: [id number] Date of Birth: 20-Aug-1929

## 2017-04-17 ENCOUNTER — Encounter: Payer: Self-pay | Admitting: Speech Pathology

## 2017-04-17 ENCOUNTER — Ambulatory Visit: Payer: Medicare Other | Admitting: Speech Pathology

## 2017-04-17 DIAGNOSIS — R490 Dysphonia: Secondary | ICD-10-CM

## 2017-04-17 NOTE — Therapy (Signed)
[place] MAIN [facility] SERVICES [address] [place], [place], [address] Phone: [phone]   Fax:  [phone]  Speech Language Pathology Treatment  Patient Details  Name: Bonnie Barker MRN: [id number] Date of Birth: Oct 09, 1929 Referring Provider: Carloyn Manner   Encounter Date: 04/17/2017      End of Session - 04/17/17 1226    Visit Number 3   Number of Visits 17   Date for SLP Re-Evaluation 06/10/17   SLP Start Time 1100   SLP Stop Time  1145   SLP Time Calculation (min) 45 min   Activity Tolerance Patient tolerated treatment well      Past Medical History:  Diagnosis Date  . Anxiety   . Breast cancer ([place])   . Bronchitis   . Carotid stenosis    bilateral  . Chronic cough   . Depression   . Diverticulitis   . Diverticulosis   . Environmental allergies   . HOH (hard of hearing)   . Hypercholesteremia   . Hypertension   . Hypothyroidism   . Neuropathy   . Osteoporosis   . Palpitations   . Sinus trouble   . Tachycardia   . Trigeminal neuralgia     Past Surgical History:  Procedure Laterality Date  . ABDOMINAL HYSTERECTOMY    . CATARACT EXTRACTION W/PHACO Right 05/03/2015   Procedure: CATARACT EXTRACTION PHACO AND INTRAOCULAR LENS PLACEMENT (IOC);  Surgeon: Birder Robson, MD;  Location: [facility] ORS;  Service: Ophthalmology;  Laterality: Right;  [place] 00:54 AP% 18.8 CDE 10.21 fluid pack lot # [id number]  . CATARACT EXTRACTION W/PHACO Left 05/17/2015   Procedure: CATARACT EXTRACTION PHACO AND INTRAOCULAR LENS PLACEMENT (IOC);  Surgeon: Birder Robson, MD;  Location: [facility] ORS;  Service: Ophthalmology;  Laterality: Left;  [place] 01:00.5 AP% 17.8 CDE 10.76 fluid pack lot # [id number]  . MASTECTOMY    . THYROIDECTOMY, PARTIAL      There were no vitals filed for this visit.      Subjective Assessment - 04/17/17 1226    Subjective "I lose my voice if I get really tired."   Currently in Pain? No/denies                ADULT SLP TREATMENT - 04/17/17 0001      General Information   Behavior/Cognition Alert;Cooperative;Pleasant mood   HPI 83 year old woman referred by Dr. Pryor Ochoa for voice therapy.  Per report, abnormal laryngeal findings include: moderate glottal gap and mild bilateral bowing of the vocal cords.      Treatment Provided   Treatment provided Cognitive-Linquistic     Pain Assessment   Pain Assessment No/denies pain     Cognitive-Linquistic Treatment   Treatment focused on Voice   Skilled Treatment The patient was provided with written and verbal teaching regarding neck, shoulder, tongue, and throat stretches exercises to promote relaxed phonation. The patient was provided with written and verbal teaching regarding breath support exercises.  Patient instructed in vocal loudness. Maintains good vocal quality; using vocal loudness; in recitation, reading, and conversation with 75% accuracy.       Assessment / Recommendations / Plan   Plan Continue with current plan of care     Progression Toward Goals   Progression toward goals Progressing toward goals          SLP Education - 04/17/17 1226    Education provided Yes   Education Details Vocal loudness   Person(s) Educated Patient   Methods Explanation   Comprehension  Verbalized understanding            SLP Long Term Goals - 04/10/17 1529      SLP LONG TERM GOAL #1   Title The patient will demonstrate independent understanding of vocal hygiene concepts and extrinsic laryngeal muscle stretches.     Time 8   Period Weeks   Status New     SLP LONG TERM GOAL #2   Title The patient will be independent for abdominal breathing and breath support exercises.   Time 8   Period Weeks   Status New     SLP LONG TERM GOAL #3   Title The patient will maximize voice quality and loudness using breath support/oral resonance for sustained vowel production, pitch glides, and hierarchal speech drill.   Time 8   Period  Weeks   Status New     SLP LONG TERM GOAL #4   Title The patient will maximize voice quality and loudness using breath support/oral resonance for paragraph length recitation with 80% accuracy.   Time 8   Period Weeks   Status New          Plan - 04/17/17 1227    Clinical Impression Statement  Patient able to improve vocal quality and decrease laryngeal strain using vocal loudness. Patient able to maintain vocal quality with loudness across tasks.   Speech Therapy Frequency 2x / week   Duration Other (comment)   Treatment/Interventions SLP instruction and feedback;Patient/family education;Other (comment)  Voice therapy   Potential to Achieve Goals Good   Potential Considerations Ability to learn/carryover information;Co-morbidities;Cooperation/participation level;Medical prognosis;Previous level of function;Severity of impairments;Family/community support   SLP Home Exercise Plan extrinsic laryngeal muscle stretches,  breath support exercises, voice buidling   Consulted and Agree with Plan of Care Patient      Patient will benefit from skilled therapeutic intervention in order to improve the following deficits and impairments:   Dysphonia    Problem List There are no active problems to display for this patient.  Leroy Sea, MS/CCC- SLP  Lou Miner 04/17/2017, 12:28 PM  [place] MAIN [facility] SERVICES [address] [place], [place], [address] Phone: [phone]   Fax:  [phone]   Name: Bonnie Barker MRN: [id number] Date of Birth: 06/19/1929

## 2017-04-22 ENCOUNTER — Ambulatory Visit: Payer: Medicare Other | Admitting: Speech Pathology

## 2017-04-22 ENCOUNTER — Encounter: Payer: Self-pay | Admitting: Speech Pathology

## 2017-04-22 DIAGNOSIS — R490 Dysphonia: Secondary | ICD-10-CM

## 2017-04-22 NOTE — Therapy (Signed)
[place] MAIN [facility] SERVICES [address] [place], [place], [address] Phone: [phone]   Fax:  [phone]  Speech Language Pathology Treatment  Patient Details  Name: Bonnie Barker MRN: [id number] Date of Birth: [birth year] Referring Provider: Carloyn Manner   Encounter Date: 04/22/2017      End of Session - 04/22/17 1305    Visit Number 4   Number of Visits 17   Date for SLP Re-Evaluation 06/10/17   SLP Start Time 1100   SLP Stop Time  1135   SLP Time Calculation (min) 35 min   Activity Tolerance Patient limited by fatigue      Past Medical History:  Diagnosis Date  . Anxiety   . Breast cancer ([place])   . Bronchitis   . Carotid stenosis    bilateral  . Chronic cough   . Depression   . Diverticulitis   . Diverticulosis   . Environmental allergies   . HOH (hard of hearing)   . Hypercholesteremia   . Hypertension   . Hypothyroidism   . Neuropathy   . Osteoporosis   . Palpitations   . Sinus trouble   . Tachycardia   . Trigeminal neuralgia     Past Surgical History:  Procedure Laterality Date  . ABDOMINAL HYSTERECTOMY    . CATARACT EXTRACTION W/PHACO Right 05/03/2015   Procedure: CATARACT EXTRACTION PHACO AND INTRAOCULAR LENS PLACEMENT (IOC);  Surgeon: Birder Robson, MD;  Location: [facility] ORS;  Service: Ophthalmology;  Laterality: Right;  [place] 00:54 AP% 18.8 CDE 10.21 fluid pack lot # [id number]  . CATARACT EXTRACTION W/PHACO Left 05/17/2015   Procedure: CATARACT EXTRACTION PHACO AND INTRAOCULAR LENS PLACEMENT (IOC);  Surgeon: Birder Robson, MD;  Location: [facility] ORS;  Service: Ophthalmology;  Laterality: Left;  [place] 01:00.5 AP% 17.8 CDE 10.76 fluid pack lot # [id number]  . MASTECTOMY    . THYROIDECTOMY, PARTIAL      There were no vitals filed for this visit.      Subjective Assessment - 04/22/17 1259    Subjective Patient said she had been working hard all morning and was hoarse. Patient had a panic attack so  the session ended early.   Currently in Pain? No/denies               ADULT SLP TREATMENT - 04/22/17 0001      General Information   Behavior/Cognition Alert;Cooperative;Pleasant mood   HPI 83 year old woman referred by Dr. Pryor Ochoa for voice therapy.  Per report, abnormal laryngeal findings include: moderate glottal gap and mild bilateral bowing of the vocal cords.      Treatment Provided   Treatment provided Cognitive-Linquistic     Pain Assessment   Pain Assessment No/denies pain     Cognitive-Linquistic Treatment   Treatment focused on Voice   Skilled Treatment Patient had poor vocal quality at the start of the session. The patient was provided with a review of verbal teaching regarding neck, shoulder, tongue, and throat stretches/exercises to promote relaxed phonation. The patient was provided with a review of verbal teaching regarding breath support exercises.  Patient instructed in vocal loudness. Patient produced good vocal quality and loudness at word level with 60% accuracy.      Assessment / Recommendations / Plan   Plan Continue with current plan of care     Progression Toward Goals   Progression toward goals Progressing toward goals          SLP Education - 04/22/17 1301  Education provided Yes   Education Details Breath support in speech   Person(s) Educated Patient   Methods Explanation   Comprehension Verbalized understanding            SLP Long Term Goals - 04/10/17 1529      SLP LONG TERM GOAL #1   Title The patient will demonstrate independent understanding of vocal hygiene concepts and extrinsic laryngeal muscle stretches.     Time 8   Period Weeks   Status New     SLP LONG TERM GOAL #2   Title The patient will be independent for abdominal breathing and breath support exercises.   Time 8   Period Weeks   Status New     SLP LONG TERM GOAL #3   Title The patient will maximize voice quality and loudness using breath support/oral  resonance for sustained vowel production, pitch glides, and hierarchal speech drill.   Time 8   Period Weeks   Status New     SLP LONG TERM GOAL #4   Title The patient will maximize voice quality and loudness using breath support/oral resonance for paragraph length recitation with 80% accuracy.   Time 8   Period Weeks   Status New          Plan - 04/22/17 1307    Clinical Impression Statement Patient is able to produce good vocal quality when coached by the clinician. Patient was limited by her fatigue and by a panic attack halfway through today's session, so she went home early. Clinician will continue to ask patient to perform her exercises with a reminder to not push herself too hard.    Speech Therapy Frequency 2x / week   Duration Other (comment)   Treatment/Interventions SLP instruction and feedback;Patient/family education;Other (comment)   Potential to Achieve Goals Good   Potential Considerations Ability to learn/carryover information;Co-morbidities;Cooperation/participation level;Medical prognosis;Previous level of function;Severity of impairments;Family/community support   SLP Home Exercise Plan extrinsic laryngeal muscle stretches,  breath support exercises, voice buidling   Consulted and Agree with Plan of Care Patient      Patient will benefit from skilled therapeutic intervention in order to improve the following deficits and impairments:   Dysphonia    Problem List There are no active problems to display for this patient.   Aline August 04/22/2017, 1:08 PM  [place] MAIN [facility] SERVICES [address] [place], [place], [address] Phone: [phone]   Fax:  [phone]   Name: Bonnie Barker MRN: [id number] Date of Birth: 1929-03-31

## 2017-04-24 ENCOUNTER — Ambulatory Visit: Payer: PRIVATE HEALTH INSURANCE | Admitting: Speech Pathology

## 2017-04-30 ENCOUNTER — Ambulatory Visit: Payer: Medicare Other | Admitting: Speech Pathology

## 2017-04-30 DIAGNOSIS — R490 Dysphonia: Secondary | ICD-10-CM | POA: Diagnosis not present

## 2017-05-01 ENCOUNTER — Encounter: Payer: Self-pay | Admitting: Speech Pathology

## 2017-05-01 NOTE — Therapy (Signed)
[place] MAIN [facility] SERVICES [address] [place], [place], [address] Phone: [phone]   Fax:  [phone]  Speech Language Pathology Treatment  Patient Details  Name: Bonnie Barker MRN: [id number] Date of Birth: 07-31-1929 Referring Provider: Carloyn Manner   Encounter Date: 04/30/2017      End of Session - 05/01/17 0825    Visit Number 5   Number of Visits 17   Date for SLP Re-Evaluation 06/10/17   SLP Start Time 1400   SLP Stop Time  1450   SLP Time Calculation (min) 50 min   Activity Tolerance Patient tolerated treatment well      Past Medical History:  Diagnosis Date  . Anxiety   . Breast cancer ([place])   . Bronchitis   . Carotid stenosis    bilateral  . Chronic cough   . Depression   . Diverticulitis   . Diverticulosis   . Environmental allergies   . HOH (hard of hearing)   . Hypercholesteremia   . Hypertension   . Hypothyroidism   . Neuropathy   . Osteoporosis   . Palpitations   . Sinus trouble   . Tachycardia   . Trigeminal neuralgia     Past Surgical History:  Procedure Laterality Date  . ABDOMINAL HYSTERECTOMY    . CATARACT EXTRACTION W/PHACO Right 05/03/2015   Procedure: CATARACT EXTRACTION PHACO AND INTRAOCULAR LENS PLACEMENT (IOC);  Surgeon: Birder Robson, MD;  Location: [facility] ORS;  Service: Ophthalmology;  Laterality: Right;  [place] 00:54 AP% 18.8 CDE 10.21 fluid pack lot # [id number]  . CATARACT EXTRACTION W/PHACO Left 05/17/2015   Procedure: CATARACT EXTRACTION PHACO AND INTRAOCULAR LENS PLACEMENT (IOC);  Surgeon: Birder Robson, MD;  Location: [facility] ORS;  Service: Ophthalmology;  Laterality: Left;  [place] 01:00.5 AP% 17.8 CDE 10.76 fluid pack lot # [id number]  . MASTECTOMY    . THYROIDECTOMY, PARTIAL      There were no vitals filed for this visit.      Subjective Assessment - 05/01/17 0824    Subjective Patient was tired from the weekend but was positive and readily engaged in conversation.    Currently in Pain? No/denies               ADULT SLP TREATMENT - 05/01/17 0001      General Information   Behavior/Cognition Alert;Cooperative;Pleasant mood   HPI 83 year old woman referred by Dr. Pryor Ochoa for voice therapy.  Per report, abnormal laryngeal findings include: moderate glottal gap and mild bilateral bowing of the vocal cords.      Treatment Provided   Treatment provided Cognitive-Linquistic     Pain Assessment   Pain Assessment No/denies pain     Cognitive-Linquistic Treatment   Treatment focused on Voice   Skilled Treatment The patient was provided with a review of verbal teaching regarding neck, shoulder, tongue, and throat stretches/exercises to promote relaxed phonation. The patient was provided with a review of verbal teaching regarding breath support exercises.  Patient instructed in vocal loudness. Patient produced good vocal quality and loudness at word level with 90% accuracy. Patient read short phrases/sentences out loud with good vocal quality and loudness with 90% accuracy. Patient read paragraphs out loud with good vocal quality and loudness with 80% accuracy. Patient answered questions with good vocal quality and loudness with 80% accuracy, but when elaborating on her answers, produced good vocal quality and loudness with only 60% accuracy. Patient produced good vocal quality and loudness in conversation with 60% accuracy.  Assessment / Recommendations / Plan   Plan Continue with current plan of care     Progression Toward Goals   Progression toward goals Progressing toward goals          SLP Education - 05/01/17 0824    Education provided Yes   Education Details Breath support in conversation   Person(s) Educated Patient   Methods Explanation   Comprehension Verbalized understanding            SLP Long Term Goals - 04/10/17 1529      SLP LONG TERM GOAL #1   Title The patient will demonstrate independent understanding of vocal hygiene  concepts and extrinsic laryngeal muscle stretches.     Time 8   Period Weeks   Status New     SLP LONG TERM GOAL #2   Title The patient will be independent for abdominal breathing and breath support exercises.   Time 8   Period Weeks   Status New     SLP LONG TERM GOAL #3   Title The patient will maximize voice quality and loudness using breath support/oral resonance for sustained vowel production, pitch glides, and hierarchal speech drill.   Time 8   Period Weeks   Status New     SLP LONG TERM GOAL #4   Title The patient will maximize voice quality and loudness using breath support/oral resonance for paragraph length recitation with 80% accuracy.   Time 8   Period Weeks   Status New          Plan - 05/01/17 0825    Clinical Impression Statement Patient is capable of producing a good quality voice when she attends to the task and puts forth adequate effort. Her vocal quality decreases sharply in conversation largely due to her lack of breath support. Clinician reminded her to slow down in conversation and make room for more breaths several times throughout the session.    Speech Therapy Frequency 2x / week   Duration Other (comment)   Treatment/Interventions SLP instruction and feedback;Patient/family education;Other (comment)   Potential to Achieve Goals Good   Potential Considerations Ability to learn/carryover information;Co-morbidities;Cooperation/participation level;Medical prognosis;Previous level of function;Severity of impairments;Family/community support   SLP Home Exercise Plan extrinsic laryngeal muscle stretches,  breath support exercises, voice buidling   Consulted and Agree with Plan of Care Patient      Patient will benefit from skilled therapeutic intervention in order to improve the following deficits and impairments:   Dysphonia    Problem List There are no active problems to display for this patient.   Aline August 05/01/2017, 8:26 AM  [place] MAIN [facility] SERVICES [address] [place], [place], [address] Phone: [phone]   Fax:  [phone]   Name: Bonnie Barker MRN: [id number] Date of Birth: 1928/11/10

## 2017-05-03 ENCOUNTER — Ambulatory Visit: Payer: PRIVATE HEALTH INSURANCE | Admitting: Speech Pathology

## 2017-05-06 ENCOUNTER — Ambulatory Visit: Payer: Medicare Other | Attending: Otolaryngology | Admitting: Speech Pathology

## 2017-05-06 ENCOUNTER — Encounter: Payer: Self-pay | Admitting: Speech Pathology

## 2017-05-06 DIAGNOSIS — R490 Dysphonia: Secondary | ICD-10-CM | POA: Insufficient documentation

## 2017-05-06 NOTE — Therapy (Signed)
[place] MAIN [facility] SERVICES [address] [place], [place], [address] Phone: [phone]   Fax:  [phone]  Speech Language Pathology Discharge Summary  Patient Details  Name: Bonnie Barker MRN: [id number] Date of Birth: [birth year] Referring Provider: Carloyn Manner   Encounter Date: 05/06/2017      End of Session - 05/06/17 1321    Visit Number 6   Number of Visits 17   Date for SLP Re-Evaluation 06/10/17   SLP Start Time 1100   SLP Stop Time  1145   SLP Time Calculation (min) 45 min   Activity Tolerance Patient tolerated treatment well      Past Medical History:  Diagnosis Date  . Anxiety   . Breast cancer ([place])   . Bronchitis   . Carotid stenosis    bilateral  . Chronic cough   . Depression   . Diverticulitis   . Diverticulosis   . Environmental allergies   . HOH (hard of hearing)   . Hypercholesteremia   . Hypertension   . Hypothyroidism   . Neuropathy   . Osteoporosis   . Palpitations   . Sinus trouble   . Tachycardia   . Trigeminal neuralgia     Past Surgical History:  Procedure Laterality Date  . ABDOMINAL HYSTERECTOMY    . CATARACT EXTRACTION W/PHACO Right 05/03/2015   Procedure: CATARACT EXTRACTION PHACO AND INTRAOCULAR LENS PLACEMENT (IOC);  Surgeon: Birder Robson, MD;  Location: [facility] ORS;  Service: Ophthalmology;  Laterality: Right;  [place] 00:54 AP% 18.8 CDE 10.21 fluid pack lot # [id number]  . CATARACT EXTRACTION W/PHACO Left 05/17/2015   Procedure: CATARACT EXTRACTION PHACO AND INTRAOCULAR LENS PLACEMENT (IOC);  Surgeon: Birder Robson, MD;  Location: [facility] ORS;  Service: Ophthalmology;  Laterality: Left;  [place] 01:00.5 AP% 17.8 CDE 10.76 fluid pack lot # [id number]  . MASTECTOMY    . THYROIDECTOMY, PARTIAL      There were no vitals filed for this visit.      Subjective Assessment - 05/06/17 1320    Subjective Patient mentioned she has been hoarser the past few days because of the weather and  activities she has been doing. Patient wants to discharge from therapy because she is very busy and can do the exercises at home.   Currently in Pain? No/denies               ADULT SLP TREATMENT - 05/06/17 0001      General Information   Behavior/Cognition Alert;Cooperative;Pleasant mood   HPI 83 year old woman referred by Dr. Pryor Ochoa for voice therapy.  Per report, abnormal laryngeal findings include: moderate glottal gap and mild bilateral bowing of the vocal cords.      Treatment Provided   Treatment provided Cognitive-Linquistic     Pain Assessment   Pain Assessment No/denies pain     Cognitive-Linquistic Treatment   Treatment focused on Voice   Skilled Treatment The patient was provided with a review of verbal teaching regarding breath support exercises.  Patient instructed in vocal loudness. Patient produced good vocal quality and loudness at word level with 85% accuracy. Patient read short phrases/sentences out loud with good vocal quality and loudness with 80% accuracy. Patient read paragraphs out loud with good vocal quality and loudness with 80% accuracy. Patient produced good vocal quality and loudness in conversation with 60% accuracy.     Assessment / Recommendations / Plan   Plan Discharge SLP treatment due to (comment)  Patient is able to produce  good vocal quality     Progression Toward Goals   Progression toward goals Goals met, education completed, patient discharged from SLP          SLP Education - 05/06/17 1320    Education provided Yes   Education Details Breath support in conversation   Person(s) Educated Patient   Methods Explanation;Demonstration   Comprehension Verbalized understanding;Returned demonstration            SLP Long Term Goals - 05/06/17 1324      SLP LONG TERM GOAL #1   Title The patient will demonstrate independent understanding of vocal hygiene concepts and extrinsic laryngeal muscle stretches.     Time 8   Period Weeks    Status Achieved     SLP LONG TERM GOAL #2   Title The patient will be independent for abdominal breathing and breath support exercises.   Time 8   Period Weeks   Status Achieved     SLP LONG TERM GOAL #3   Title The patient will maximize voice quality and loudness using breath support/oral resonance for sustained vowel production, pitch glides, and hierarchal speech drill.   Time 8   Period Weeks   Status Partially Met     SLP LONG TERM GOAL #4   Title The patient will maximize voice quality and loudness using breath support/oral resonance for paragraph length recitation with 80% accuracy.   Time 8   Period Weeks   Status Achieved          Plan - 05/06/17 1324    Clinical Impression Statement Patient is capable of producing a good quality voice, especially at the word level and while reading out loud, when she attends to the task and puts forth appropriate effort. Her vocal quality decreases sharply in conversation largely due to her lack of breath support. Clinician reminded her to slow down in conversation and make room for more breaths throughout the session. Patient has been provided with written and verbal teaching regarding neck, shoulder, tongue, and throat stretches exercises to promote relaxed phonation, as well as written and verbal teaching regarding breath support exercises. Patient has decided to exit from speech therapy because she has learned how to perform the exercises she does in therapy, she understands that her voice is less hoarse when she uses proper loudness from breath support, and she is able to self-monitor and determine when she is using good vocal quality.   Speech Therapy Frequency Other (comment)   Duration Other (comment)   Treatment/Interventions SLP instruction and feedback;Patient/family education;Other (comment)   Potential to Achieve Goals Good   Potential Considerations Ability to learn/carryover  information;Co-morbidities;Cooperation/participation level;Medical prognosis;Previous level of function;Severity of impairments;Family/community support   SLP Home Exercise Plan extrinsic laryngeal muscle stretches,  breath support exercises, voice buidling   Consulted and Agree with Plan of Care Patient      Patient will benefit from skilled therapeutic intervention in order to improve the following deficits and impairments:   Dysphonia    Problem List There are no active problems to display for this patient.   Aline August 05/06/2017, 1:26 PM  [place] MAIN [facility] SERVICES [address] [place], [place], [address] Phone: [phone]   Fax:  [phone]   Name: Bonnie Barker MRN: [id number] Date of Birth: 1929/04/13

## 2017-05-10 ENCOUNTER — Ambulatory Visit: Payer: PRIVATE HEALTH INSURANCE | Admitting: Speech Pathology

## 2017-05-14 ENCOUNTER — Ambulatory Visit: Payer: PRIVATE HEALTH INSURANCE | Admitting: Speech Pathology

## 2017-05-16 ENCOUNTER — Ambulatory Visit: Payer: PRIVATE HEALTH INSURANCE | Admitting: Speech Pathology

## 2017-05-20 ENCOUNTER — Ambulatory Visit: Payer: PRIVATE HEALTH INSURANCE | Admitting: Speech Pathology

## 2017-05-22 ENCOUNTER — Ambulatory Visit: Payer: PRIVATE HEALTH INSURANCE | Admitting: Speech Pathology

## 2017-05-27 ENCOUNTER — Ambulatory Visit: Payer: PRIVATE HEALTH INSURANCE | Admitting: Speech Pathology

## 2017-05-29 ENCOUNTER — Ambulatory Visit: Payer: PRIVATE HEALTH INSURANCE | Admitting: Speech Pathology

## 2019-01-09 ENCOUNTER — Other Ambulatory Visit: Payer: Self-pay

## 2019-01-09 ENCOUNTER — Encounter: Payer: Self-pay | Admitting: Emergency Medicine

## 2019-01-09 ENCOUNTER — Emergency Department: Payer: Medicare Other

## 2019-01-09 ENCOUNTER — Emergency Department
Admission: EM | Admit: 2019-01-09 | Discharge: 2019-01-09 | Disposition: A | Discharge status: Home / Self Care | Payer: Medicare Other | Attending: Emergency Medicine | Admitting: Emergency Medicine

## 2019-01-09 DIAGNOSIS — Z79899 Other long term (current) drug therapy: Secondary | ICD-10-CM | POA: Insufficient documentation

## 2019-01-09 DIAGNOSIS — Z853 Personal history of malignant neoplasm of breast: Secondary | ICD-10-CM | POA: Diagnosis not present

## 2019-01-09 DIAGNOSIS — R5383 Other fatigue: Secondary | ICD-10-CM | POA: Insufficient documentation

## 2019-01-09 DIAGNOSIS — R079 Chest pain, unspecified: Secondary | ICD-10-CM | POA: Insufficient documentation

## 2019-01-09 DIAGNOSIS — I10 Essential (primary) hypertension: Secondary | ICD-10-CM | POA: Diagnosis not present

## 2019-01-09 DIAGNOSIS — E039 Hypothyroidism, unspecified: Secondary | ICD-10-CM | POA: Insufficient documentation

## 2019-01-09 DIAGNOSIS — Z7982 Long term (current) use of aspirin: Secondary | ICD-10-CM | POA: Insufficient documentation

## 2019-01-09 DIAGNOSIS — E860 Dehydration: Secondary | ICD-10-CM | POA: Insufficient documentation

## 2019-01-09 DIAGNOSIS — R531 Weakness: Secondary | ICD-10-CM | POA: Diagnosis not present

## 2019-01-09 DIAGNOSIS — R42 Dizziness and giddiness: Secondary | ICD-10-CM | POA: Insufficient documentation

## 2019-01-09 LAB — COMPREHENSIVE METABOLIC PANEL
ALBUMIN: 4.5 g/dL (ref 3.5–5.0)
ALT: 17 U/L (ref 0–44)
AST: 27 U/L (ref 15–41)
Alkaline Phosphatase: 61 U/L (ref 38–126)
Anion gap: 11 (ref 5–15)
BILIRUBIN TOTAL: 0.7 mg/dL (ref 0.3–1.2)
BUN: 25 mg/dL — AB (ref 8–23)
CHLORIDE: 101 mmol/L (ref 98–111)
CO2: 23 mmol/L (ref 22–32)
CREATININE: 0.85 mg/dL (ref 0.44–1.00)
Calcium: 9.9 mg/dL (ref 8.9–10.3)
GFR calc Af Amer: >60 mL/min (ref >60)
GLUCOSE: 98 mg/dL (ref 70–99)
POTASSIUM: 4.7 mmol/L (ref 3.5–5.1)
Sodium: 135 mmol/L (ref 135–145)
Total Protein: 7.5 g/dL (ref 6.5–8.1)

## 2019-01-09 LAB — CBC WITH DIFFERENTIAL/PLATELET
ABS IMMATURE GRANULOCYTES: 0.04 10*3/uL (ref 0.00–0.07)
Basophils Absolute: 0.1 10*3/uL (ref 0.0–0.1)
Basophils Relative: 1 %
EOS ABS: 0.1 10*3/uL (ref 0.0–0.5)
Eosinophils Relative: 2 %
HEMATOCRIT: 42 % (ref 36.0–46.0)
Hemoglobin: 13.6 g/dL (ref 12.0–15.0)
IMMATURE GRANULOCYTES: 1 %
LYMPHS ABS: 1.9 10*3/uL (ref 0.7–4.0)
Lymphocytes Relative: 24 %
MCH: 29 pg (ref 26.0–34.0)
MCHC: 32.4 g/dL (ref 30.0–36.0)
MCV: 89.6 fL (ref 80.0–100.0)
MONOS PCT: 10 %
Monocytes Absolute: 0.8 10*3/uL (ref 0.1–1.0)
NEUTROS ABS: 5 10*3/uL (ref 1.7–7.7)
NEUTROS PCT: 62 %
NRBC: 0 % (ref 0.0–0.2)
PLATELETS: 290 10*3/uL (ref 150–400)
RBC: 4.69 MIL/uL (ref 3.87–5.11)
RDW: 12.5 % (ref 11.5–15.5)
WBC: 8 10*3/uL (ref 4.0–10.5)

## 2019-01-09 LAB — TROPONIN I

## 2019-01-09 LAB — TSH: TSH: 1.99 u[IU]/mL (ref 0.350–4.500)

## 2019-01-09 LAB — T4, FREE: Free T4: 1.16 ng/dL (ref 0.82–1.77)

## 2019-01-09 MED ORDER — IOHEXOL 350 MG/ML SOLN
75.0000 mL | Freq: Once | INTRAVENOUS | Status: AC | PRN
  Administered 2019-01-09: 75 mL via INTRAVENOUS

## 2019-01-09 MED ORDER — SODIUM CHLORIDE 0.9 % IV BOLUS
1000.0000 mL | Freq: Once | INTRAVENOUS | Status: AC
  Administered 2019-01-09: 1000 mL via INTRAVENOUS

## 2019-01-09 NOTE — ED Triage Notes (Signed)
Pt here with c/o chest discomfort that started a "while ago." Dr Gorden Harms sent her here with concern for her hear rate, pain in arms bilaterally, NAD, states tightness in her chest as well, has a dry cough also.

## 2019-01-09 NOTE — ED Notes (Signed)
Pt up to bedside commode without difficulty, dizziness or lightheadedness. Pt in NAD at this time.

## 2019-01-09 NOTE — ED Notes (Addendum)
Pt is allergic to Shrimp. Pt denies ever having a reaction CT scan with dye and denies knowledge of a reaction to iodine dye.

## 2019-01-09 NOTE — ED Provider Notes (Signed)
[facility] Emergency Department Provider Note  ____________________________________________  Time seen: Approximately 12:44 PM  I have reviewed the triage vital signs and the nursing notes.   HISTORY  Chief Complaint Chest Pain   HPI Bonnie Barker is a 85 y.o. female with a history of remote breast cancer, hypertension, hyperlipidemia, hypothyroidism, trigeminal neuralgia who presents for evaluation of chest pain.  Patient reports that she has had tightness across her chest which is constant and unchanged for over a year.  For the last few days she started having a throbbing achy pain on both her arms which is worse when she elevates the arms or uses her arms for an extended period of time.  She has had dyspnea with exertion and dizziness which she describes as lightheadedness also for several months.  Has seen her cardiologist for this.  Had a stress test in January 2019 which was unremarkable.  She reports over the last 2 days that she has been feeling weaker and more fatigued although has had weakness and fatigue for over a year.  Today she went to see her primary care doctor but he was not available.  She saw his PA.  She was noted to be tachycardic and sent to the emergency room for evaluation.  She denies cough or congestion, fever chills, abdominal pain, vomiting or diarrhea, dysuria.  Past Medical History:  Diagnosis Date  . Anxiety   . Breast cancer ([place])   . Bronchitis   . Carotid stenosis    bilateral  . Chronic cough   . Depression   . Diverticulitis   . Diverticulosis   . Environmental allergies   . HOH (hard of hearing)   . Hypercholesteremia   . Hypertension   . Hypothyroidism   . Neuropathy   . Osteoporosis   . Palpitations   . Sinus trouble   . Tachycardia   . Trigeminal neuralgia     There are no active problems to display for this patient.   Past Surgical History:  Procedure Laterality Date  . ABDOMINAL HYSTERECTOMY      . CATARACT EXTRACTION W/PHACO Right 05/03/2015   Procedure: CATARACT EXTRACTION PHACO AND INTRAOCULAR LENS PLACEMENT (IOC);  Surgeon: Birder Robson, MD;  Location: [facility] ORS;  Service: Ophthalmology;  Laterality: Right;  [place] 00:54 AP% 18.8 CDE 10.21 fluid pack lot # [id number]  . CATARACT EXTRACTION W/PHACO Left 05/17/2015   Procedure: CATARACT EXTRACTION PHACO AND INTRAOCULAR LENS PLACEMENT (IOC);  Surgeon: Birder Robson, MD;  Location: [facility] ORS;  Service: Ophthalmology;  Laterality: Left;  [place] 01:00.5 AP% 17.8 CDE 10.76 fluid pack lot # [id number]  . MASTECTOMY    . THYROIDECTOMY, PARTIAL      Prior to Admission medications   Medication Sig Start Date End Date Taking? Authorizing Provider  amLODipine (NORVASC) 5 MG tablet Take 5 mg by mouth daily.    [provider]  aspirin 81 MG tablet Take 81 mg by mouth daily.    [provider]  calcium citrate-vitamin D (CITRACAL+D) 315-200 MG-UNIT per tablet Take 1 tablet by mouth 2 (two) times daily.    [provider]  levothyroxine (SYNTHROID, LEVOTHROID) 75 MCG tablet Take 75 mcg by mouth daily before breakfast.    [provider]  loratadine (CLARITIN) 10 MG tablet Take 10 mg by mouth daily.    [provider]  Multiple Vitamin (MULTIVITAMIN) tablet Take 1 tablet by mouth daily.    [provider]  naproxen sodium (ANAPROX) 220 MG  tablet Take 220 mg by mouth 2 (two) times daily as needed.    [provider]  omeprazole (PRILOSEC) 10 MG capsule Take 10 mg by mouth daily.    [provider]  Vit C-Cholecalciferol-Rose Hip (VITAMIN C & D3/ROSE HIPS) 254-2706-23 MG-UNIT-MG CAPS Take by mouth.    [provider]    Allergies Acthib [haemophilus b polysacc tetanus toxoid conj vaccine]; Apap [acetaminophen]; Banana concentrate [banana]; Benadryl [diphenhydramine hcl (sleep)]; Bentyl [dicyclomine hcl]; Biaxin [clarithromycin]; Codeine; Eryc [erythromycin]; Etodolac;  Flagyl [metronidazole]; Fosamax [alendronate sodium]; Penicillins; Shellfish allergy; Statins; Tetracyclines & related; Tramadol; and Vibramycin [doxycycline calcium]  History reviewed. No pertinent family history.  Social History Social History   Tobacco Use  . Smoking status: Never Smoker  . Smokeless tobacco: Never Used  Substance Use Topics  . Alcohol use: No  . Drug use: Not on file    Review of Systems  Constitutional: Negative for fever. Eyes: Negative for visual changes. ENT: Negative for sore throat. Neck: No neck pain  Cardiovascular: + chest pain. Respiratory: + shortness of breath. Gastrointestinal: Negative for abdominal pain, vomiting or diarrhea. Genitourinary: Negative for dysuria. Musculoskeletal: Negative for back pain. + b/l arm pain Skin: Negative for rash. Neurological: Negative for headaches, weakness or numbness. Psych: No SI or HI  ____________________________________________   PHYSICAL EXAM:  VITAL SIGNS: ED Triage Vitals  Enc Vitals Group     BP 01/09/19 1104 131/88     Pulse Rate 01/09/19 1104 (!) 116     Resp 01/09/19 1104 18     Temp 01/09/19 1104 98.2 F (36.8 C)     Temp Source 01/09/19 1104 Oral     SpO2 01/09/19 1104 98 %     Weight 01/09/19 1105 130 lb (59 kg)     Height 01/09/19 1105 5\' 3"  (1.6 m)     Head Circumference --      Peak Flow --      Pain Score 01/09/19 1105 7     Pain Loc --      Pain Edu? --      Excl. in [place]? --     Constitutional: Alert and oriented. Well appearing and in no apparent distress. HEENT:      Head: Normocephalic and atraumatic.         Eyes: Conjunctivae are normal. Sclera is non-icteric.       Mouth/Throat: Mucous membranes are moist.       Neck: Supple with no signs of meningismus. Cardiovascular: Tachycardic with regular rhythm. No murmurs, gallops, or rubs. 2+ symmetrical distal pulses are present in all extremities. No JVD. Respiratory: Normal respiratory effort. Lungs are clear to  auscultation bilaterally. No wheezes, crackles, or rhonchi.  Gastrointestinal: Soft, non tender, and non distended with positive bowel sounds. No rebound or guarding. Genitourinary: No CVA tenderness. Musculoskeletal: Nontender with normal range of motion in all extremities. No edema, cyanosis, or erythema of extremities. Neurologic: Normal speech and language. Face is symmetric. Moving all extremities. No gross focal neurologic deficits are appreciated. Skin: Skin is warm, dry and intact. No rash noted. Psychiatric: Mood and affect are normal. Speech and behavior are normal.  ____________________________________________   LABS (all labs ordered are listed, but only abnormal results are displayed)  Labs Reviewed  COMPREHENSIVE METABOLIC PANEL - Abnormal; Notable for the following components:      Result Value   BUN 25 (*)    All other components within normal limits  CBC WITH DIFFERENTIAL/PLATELET  TROPONIN I  TSH  T4, FREE  TROPONIN I   ____________________________________________  EKG  ED ECG REPORT I, Rudene Re, the attending physician, personally viewed and interpreted this ECG.  Sinus tachycardia, rate of 116, normal intervals, normal axis, no ST elevations or depressions. ____________________________________________  RADIOLOGY  I have personally reviewed the images performed during this visit and I agree with the Radiologist's read.   Interpretation by Radiologist:  Ct Angio Chest Pe W And/or Wo Contrast  Result Date: 01/09/2019 CLINICAL DATA:  Constant chest tightness for 1 month. Bilateral arm pain recent pre syncopal episode. Dyspnea on exertion, new onset. EXAM: CT ANGIOGRAPHY CHEST WITH CONTRAST TECHNIQUE: Multidetector CT imaging of the chest was performed using the standard protocol during bolus administration of intravenous contrast. Multiplanar CT image reconstructions and MIPs were obtained to evaluate the vascular anatomy. CONTRAST:  75mL OMNIPAQUE  IOHEXOL 350 MG/ML SOLN COMPARISON:  Chest radiograph 07/29/2016 FINDINGS: Cardiovascular: --Pulmonary arteries: Contrast injection is sufficient to demonstrate satisfactory opacification of the pulmonary arteries to the segmental level. There is no pulmonary embolus. The main pulmonary artery is within normal limits for size. --Aorta: Satisfactory opacification of the thoracic aorta. No aortic dissection or other acute aortic syndrome. Conventional 3 vessel aortic branching pattern. The aortic course and caliber are normal. There is mild aortic atherosclerosis. --Heart: Normal size. No pericardial effusion. Mediastinum/Nodes: No mediastinal, hilar or axillary lymphadenopathy. The visualized thyroid and thoracic esophageal course are unremarkable. Lungs/Pleura: No pulmonary nodules or masses. No pleural effusion or pneumothorax. No focal airspace consolidation. No focal pleural abnormality. Upper Abdomen: Contrast bolus timing is not optimized for evaluation of the abdominal organs. Within this limitation, the visualized organs of the upper abdomen are normal. Musculoskeletal: No chest wall abnormality. No acute or significant osseous findings. Review of the MIP images confirms the above findings. IMPRESSION: No pulmonary embolus or other acute thoracic abnormality. Aortic atherosclerosis (ICD10-I70.0). Electronically Signed   By: Ulyses Jarred M.D.   On: 01/09/2019 13:45      ____________________________________________   PROCEDURES  Procedure(s) performed: None Procedures Critical Care performed:  None ____________________________________________   INITIAL IMPRESSION / ASSESSMENT AND PLAN / ED COURSE  85 y.o. female with a history of remote breast cancer, hypertension, hyperlipidemia, hypothyroidism, trigeminal neuralgia who presents for evaluation of chest pain, SOB, and b/l arm pain.  Patient is tachycardic with normal work of breathing, normal sats, lungs are clear to auscultation.  EKG showing  sinus tachycardia with no evidence of ischemia or dysrhythmias.  With a history of remote breast cancer we will do a CT angio to rule out PE. Will check labs to rule out anemia, dehydration, electrolyte abnormalities.  Will monitor patient on telemetry.  Clinical Course as of Jan 08 1542  Fri Jan 09, 2019  1542 Tachycardia resolved after IV fluids consistent with dehydration.  Labs showing normal thyroid, no evidence of anemia, normal electrolytes, no AKI, normal kidney function.  CT of the chest showing no PE or dissection, no pneumonia, no signs of recurrent cancer.  Patient reports improvement of her symptoms after IV fluids.  Will discharge home with follow-up with her cardiologist.  Discussed standard return precautions.   [CV]    Clinical Course User Index [CV] Alfred Levins [place], MD     As part of my medical decision making, I reviewed the following data within the [facility] notes reviewed and incorporated, Labs reviewed , EKG interpreted , Old chart reviewed, Radiograph reviewed , Notes from prior ED visits and [place] Controlled Substance Database  Pertinent labs & imaging results that were available during my care of the patient were reviewed by me and considered in my medical decision making (see chart for details).    ____________________________________________   FINAL CLINICAL IMPRESSION(S) / ED DIAGNOSES  Final diagnoses:  Chest pain, unspecified type  Dehydration      NEW MEDICATIONS STARTED DURING THIS VISIT:  ED Discharge Orders    None       Note:  This document was prepared using Dragon voice recognition software and may include unintentional dictation errors.    Rudene Re, MD 01/09/19 [phone]

## 2019-01-09 NOTE — Discharge Instructions (Addendum)

## 2019-01-09 NOTE — ED Triage Notes (Signed)
Chest tightness for a month that is constant.  Has not seen her cardiologist for this.  Having pain in both arms.  Denies nausea or vomiting. DOE.  Will have SHOB walking to get mail which is new.

## 2019-01-09 NOTE — ED Notes (Signed)
Patient transported to CT 

## 2019-01-13 DIAGNOSIS — R0602 Shortness of breath: Secondary | ICD-10-CM | POA: Insufficient documentation

## 2020-07-13 ENCOUNTER — Other Ambulatory Visit: Payer: Self-pay | Admitting: Internal Medicine

## 2022-05-20 ENCOUNTER — Observation Stay
Admission: EM | Admit: 2022-05-20 | Discharge: 2022-05-21 | Disposition: A | Discharge status: Home / Self Care | Payer: Medicare HMO | Attending: Internal Medicine | Admitting: Internal Medicine

## 2022-05-20 ENCOUNTER — Other Ambulatory Visit: Payer: Self-pay

## 2022-05-20 ENCOUNTER — Emergency Department: Payer: Medicare HMO

## 2022-05-20 DIAGNOSIS — Z7982 Long term (current) use of aspirin: Secondary | ICD-10-CM | POA: Insufficient documentation

## 2022-05-20 DIAGNOSIS — R072 Precordial pain: Principal | ICD-10-CM | POA: Insufficient documentation

## 2022-05-20 DIAGNOSIS — R531 Weakness: Secondary | ICD-10-CM | POA: Diagnosis not present

## 2022-05-20 DIAGNOSIS — R0789 Other chest pain: Secondary | ICD-10-CM | POA: Diagnosis present

## 2022-05-20 DIAGNOSIS — R0602 Shortness of breath: Secondary | ICD-10-CM | POA: Diagnosis present

## 2022-05-20 DIAGNOSIS — E039 Hypothyroidism, unspecified: Secondary | ICD-10-CM | POA: Insufficient documentation

## 2022-05-20 DIAGNOSIS — R778 Other specified abnormalities of plasma proteins: Secondary | ICD-10-CM | POA: Insufficient documentation

## 2022-05-20 DIAGNOSIS — R079 Chest pain, unspecified: Secondary | ICD-10-CM | POA: Diagnosis not present

## 2022-05-20 DIAGNOSIS — Z79899 Other long term (current) drug therapy: Secondary | ICD-10-CM | POA: Diagnosis not present

## 2022-05-20 DIAGNOSIS — I200 Unstable angina: Secondary | ICD-10-CM | POA: Insufficient documentation

## 2022-05-20 DIAGNOSIS — I160 Hypertensive urgency: Secondary | ICD-10-CM | POA: Diagnosis not present

## 2022-05-20 DIAGNOSIS — Z853 Personal history of malignant neoplasm of breast: Secondary | ICD-10-CM | POA: Insufficient documentation

## 2022-05-20 DIAGNOSIS — I10 Essential (primary) hypertension: Secondary | ICD-10-CM | POA: Diagnosis not present

## 2022-05-20 LAB — BASIC METABOLIC PANEL
Anion gap: 7 (ref 5–15)
BUN: 23 mg/dL (ref 8–23)
CO2: 27 mmol/L (ref 22–32)
Calcium: 10 mg/dL (ref 8.9–10.3)
Chloride: 101 mmol/L (ref 98–111)
Creatinine, Ser: 0.92 mg/dL (ref 0.44–1.00)
GFR, Estimated: 59 mL/min — ABNORMAL LOW (ref >60)
Glucose, Bld: 135 mg/dL — ABNORMAL HIGH (ref 70–99)
Potassium: 3.8 mmol/L (ref 3.5–5.1)
Sodium: 135 mmol/L (ref 135–145)

## 2022-05-20 LAB — CBC
HCT: 42.2 % (ref 36.0–46.0)
Hemoglobin: 13.5 g/dL (ref 12.0–15.0)
MCH: 29 pg (ref 26.0–34.0)
MCHC: 32 g/dL (ref 30.0–36.0)
MCV: 90.6 fL (ref 80.0–100.0)
Platelets: 273 10*3/uL (ref 150–400)
RBC: 4.66 MIL/uL (ref 3.87–5.11)
RDW: 11.9 % (ref 11.5–15.5)
WBC: 7.8 10*3/uL (ref 4.0–10.5)
nRBC: 0 % (ref 0.0–0.2)

## 2022-05-20 LAB — TSH: TSH: 2.998 u[IU]/mL (ref 0.350–4.500)

## 2022-05-20 LAB — TROPONIN I (HIGH SENSITIVITY)
Troponin I (High Sensitivity): 6 ng/L (ref <18)
Troponin I (High Sensitivity): 7 ng/L (ref <18)

## 2022-05-20 LAB — T4, FREE: Free T4: 0.92 ng/dL (ref 0.61–1.12)

## 2022-05-20 MED ORDER — HEPARIN SODIUM (PORCINE) 5000 UNIT/ML IJ SOLN
5000.0000 [IU] | Freq: Three times a day (TID) | INTRAMUSCULAR | Status: DC
  Administered 2022-05-20 – 2022-05-21 (×2): 5000 [IU] via SUBCUTANEOUS
  Filled 2022-05-20 (×2): qty 1

## 2022-05-20 MED ORDER — ASPIRIN 81 MG PO CHEW
324.0000 mg | CHEWABLE_TABLET | Freq: Once | ORAL | Status: AC
  Administered 2022-05-20: 324 mg via ORAL
  Filled 2022-05-20: qty 4

## 2022-05-20 MED ORDER — ASPIRIN 81 MG PO TBEC
81.0000 mg | DELAYED_RELEASE_TABLET | Freq: Every day | ORAL | Status: DC
  Administered 2022-05-21: 81 mg via ORAL
  Filled 2022-05-20: qty 1

## 2022-05-20 MED ORDER — LEVOTHYROXINE SODIUM 50 MCG PO TABS
75.0000 ug | ORAL_TABLET | Freq: Every day | ORAL | Status: DC
  Administered 2022-05-21: 75 ug via ORAL
  Filled 2022-05-20: qty 1

## 2022-05-20 MED ORDER — LACTATED RINGERS IV BOLUS
1000.0000 mL | Freq: Once | INTRAVENOUS | Status: AC
  Administered 2022-05-20: 1000 mL via INTRAVENOUS

## 2022-05-20 MED ORDER — ALUM & MAG HYDROXIDE-SIMETH 200-200-20 MG/5ML PO SUSP
30.0000 mL | Freq: Once | ORAL | Status: AC
  Administered 2022-05-20: 30 mL via ORAL
  Filled 2022-05-20: qty 30

## 2022-05-20 MED ORDER — ONDANSETRON HCL 4 MG/2ML IJ SOLN: 4.0000 mg | Freq: Four times a day (QID) | INTRAMUSCULAR | Status: DC | PRN

## 2022-05-20 MED ORDER — IOHEXOL 350 MG/ML SOLN
75.0000 mL | Freq: Once | INTRAVENOUS | Status: AC | PRN
Start: 2022-05-20 — End: 2022-05-20
  Administered 2022-05-20: 75 mL via INTRAVENOUS

## 2022-05-20 MED ORDER — VITAMIN D 25 MCG (1000 UNIT) PO TABS
1000.0000 [IU] | ORAL_TABLET | Freq: Every day | ORAL | Status: DC
  Administered 2022-05-21: 1000 [IU] via ORAL
  Filled 2022-05-20: qty 1

## 2022-05-20 MED ORDER — PANTOPRAZOLE SODIUM 40 MG PO TBEC
40.0000 mg | DELAYED_RELEASE_TABLET | Freq: Every day | ORAL | Status: DC
  Administered 2022-05-21: 40 mg via ORAL
  Filled 2022-05-20: qty 1

## 2022-05-20 MED ORDER — ASCORBIC ACID 500 MG PO TABS
500.0000 mg | ORAL_TABLET | Freq: Every day | ORAL | Status: DC
  Administered 2022-05-21: 500 mg via ORAL
  Filled 2022-05-20: qty 1

## 2022-05-20 MED ORDER — LORATADINE 10 MG PO TABS
10.0000 mg | ORAL_TABLET | Freq: Every day | ORAL | Status: DC
  Administered 2022-05-21: 10 mg via ORAL
  Filled 2022-05-20: qty 1

## 2022-05-20 MED ORDER — LABETALOL HCL 5 MG/ML IV SOLN
20.0000 mg | Freq: Once | INTRAVENOUS | Status: AC
Start: 2022-05-20 — End: 2022-05-20
  Administered 2022-05-20: 20 mg via INTRAVENOUS
  Filled 2022-05-20: qty 4

## 2022-05-20 MED ORDER — ISOSORBIDE MONONITRATE ER 30 MG PO TB24
30.0000 mg | ORAL_TABLET | Freq: Every day | ORAL | Status: DC
  Filled 2022-05-20: qty 1

## 2022-05-20 MED ORDER — ADULT MULTIVITAMIN W/MINERALS CH
1.0000 | ORAL_TABLET | Freq: Every day | ORAL | Status: DC
  Administered 2022-05-21: 1 via ORAL
  Filled 2022-05-20: qty 1

## 2022-05-20 NOTE — ED Notes (Signed)
Pt taken to CT at this time.

## 2022-05-20 NOTE — ED Provider Notes (Signed)
[facility] Provider Note    Event Date/Time   First MD Initiated Contact with Patient 05/20/22 1746     (approximate)   History   Chief Complaint: Chest Pain, Shortness of Breath, and Weakness   HPI  Bonnie Barker is a 88 y.o. female with a history of hypertension, hypothyroidism, breast cancer status post mastectomy who comes ED complaining of shortness of breath, generalized weakness, central chest pressure for the past 2 weeks.  Intermittent episodes, worse with exertion and walking, better with rest.  Over the last several days it is becoming more persistent, occurring more easily and lasting for several hours before it alleviates.  She has had to mostly stay in bed the last 2 days.  Denies vomiting fever diarrhea constipation or abdominal pain.  Pain is nonradiating.  No diaphoresis.     Physical Exam   Triage Vital Signs: ED Triage Vitals [05/20/22 1334]  Enc Vitals Group     BP (!) 208/107     Pulse Rate (!) 119     Resp 18     Temp 98.5 F (36.9 C)     Temp Source Oral     SpO2 100 %     Weight 116 lb (52.6 kg)     Height '5\' 3"'$  (1.6 m)     Head Circumference      Peak Flow      Pain Score 0     Pain Loc      Pain Edu?      Excl. in [place]?     Most recent vital signs: Vitals:   05/20/22 2000 05/20/22 2100  BP: (!) 167/84 (!) 166/92  Pulse: 82 71  Resp: 14 15  Temp:    SpO2: 100% 100%    General: Awake, no distress.  CV:  Good peripheral perfusion.  Tachycardia heart rate 110 Resp:  Normal effort.  Clear to auscultation bilaterally Abd:  No distention.  Soft nontender Other:  No lower extremity edema.   ED Results / Procedures / Treatments   Labs (all labs ordered are listed, but only abnormal results are displayed) Labs Reviewed  BASIC METABOLIC PANEL - Abnormal; Notable for the following components:      Result Value   Glucose, Bld 135 (*)    GFR, Estimated 59 (*)    All other components within normal limits  CBC   T4, FREE  TSH  LIPID PANEL  TROPONIN I (HIGH SENSITIVITY)  TROPONIN I (HIGH SENSITIVITY)  TROPONIN I (HIGH SENSITIVITY)     EKG Interpreted by me Sinus tachycardia rate 119.  Normal axis and intervals.  Poor R wave progression.  Normal ST segments and T waves.  No ischemic changes.   RADIOLOGY Chest x-ray viewed and interpreted by me, appears normal.  Radiology report reviewed.  CT angiogram chest negative for PE or other acute findings.   PROCEDURES:  Procedures   MEDICATIONS ORDERED IN ED: Medications  aspirin EC tablet 81 mg (has no administration in time range)  isosorbide mononitrate (IMDUR) 24 hr tablet 30 mg (has no administration in time range)  levothyroxine (SYNTHROID) tablet 75 mcg (has no administration in time range)  pantoprazole (PROTONIX) EC tablet 40 mg (has no administration in time range)  ascorbic acid (VITAMIN C) tablet 500 mg (has no administration in time range)  cholecalciferol (VITAMIN D3) tablet 1,000 Units (has no administration in time range)  multivitamin with minerals tablet 1 tablet (has no administration in time range)  loratadine (CLARITIN) tablet 10 mg (has no administration in time range)  ondansetron (ZOFRAN) injection 4 mg (has no administration in time range)  heparin injection 5,000 Units (has no administration in time range)  alum & mag hydroxide-simeth (MAALOX/MYLANTA) 200-200-20 MG/5ML suspension 30 mL (has no administration in time range)  lactated ringers bolus 1,000 mL (0 mLs Intravenous Stopped 05/20/22 2040)  labetalol (NORMODYNE) injection 20 mg (20 mg Intravenous Given 05/20/22 1847)  iohexol (OMNIPAQUE) 350 MG/ML injection 75 mL (75 mLs Intravenous Contrast Given 05/20/22 1920)  aspirin chewable tablet 324 mg (324 mg Oral Given 05/20/22 2025)     IMPRESSION / MDM / ASSESSMENT AND PLAN / ED COURSE  I reviewed the triage vital signs and the nursing notes.                              Differential diagnosis includes, but  is not limited to, non-STEMI, pulmonary embolism, pleural effusion, pneumonia, pulmonary edema, angina pectoris  Patient's presentation is most consistent with acute presentation with potential threat to life or bodily function.  Patient presents with exertional chest pressure with shortness of breath that has been worsening.  Currently pain-free in the ED.  EKG is nonischemic, labs are unremarkable.  Chest x-ray and CT angiogram of the chest are both unremarkable.  At this point with her symptoms, she will need to be hospitalized for further cardiac work-up.  Case discussed with the hospitalist.  Aspirin given.       FINAL CLINICAL IMPRESSION(S) / ED DIAGNOSES   Final diagnoses:  Precordial pain     Rx / DC Orders   ED Discharge Orders     None        Note:  This document was prepared using Dragon voice recognition software and may include unintentional dictation errors.   Carrie Mew, MD 05/20/22 2253

## 2022-05-20 NOTE — ED Triage Notes (Signed)
Pt c/o generalized weakness, SOB with substernal chest pressure for the past several weeks. States it is intermittent, in the past 2 days has been more persistent. Pt is in NAD on arrival, skin is warm and dry,. Respirations WNL

## 2022-05-20 NOTE — ED Notes (Signed)
Pt able to ambulate to in room toilet and back with 2 person assist. Family in room.

## 2022-05-20 NOTE — ED Notes (Signed)
Pt O2 sats intermittently in high 80s.  Placed on 2L [place] at this time.  [place] notified. Family remains at bedside.

## 2022-05-20 NOTE — H&P (Signed)
History and Physical    Bonnie Barker:[id number] DOB: 1929/08/12 DOA: 05/20/2022  PCP: Jerl Mina, MD  Patient coming from: home  I have personally briefly reviewed patient's old medical records in [facility]  Chief Complaint: intermittent chest pain x weeks with progressive episodes over last few days  HPI: Bonnie Barker is a 88 y.o. female with medical history significant of  SVT, HLD,HTN ,Depression , Anxiety , Hypothyroidism, BRCA , b/l carotid A stenosis who presents with intermittent episode of chest pain over the last few weeks associated with fatigue and sob. She notes over the last week her pain episodes have been more frequent and last longer. She notes improvement in symptoms with rest and worse with exertion. She describes pain as central pressure w/o radiation. She notes no associated n/v /or diaphoresis.  Patient on further ros notes no fever/chills/cough /n/v/d/dysuria with associated symptoms.   ED Course:  Afeb, BP 208/107, HR 119, rr 18 sat 100% on ra  Labs Wbc 7.8, hgb 13.5, plt 273 Na 135, K 3.8, CL 101 gluc 135, cr 0.92,  CE 6 T4,0.92 Tx Lr 1L , labetolol 20mg  iv ,asa  CTPA IMPRESSION: 1. No pulmonary embolus. 2. Chronic biapical pleuroparenchymal scarring and upper lobe bronchiectasis. 3. Minimal contrast refluxing into the hepatic veins, can be seen with elevated right heart pressures.IMPRESSION: 1. No pulmonary embolus. 2. Chronic biapical pleuroparenchymal scarring and upper lobe bronchiectasis. 3. Minimal contrast refluxing into the hepatic veins, can be seen with elevated right heart pressures.   Review of Systems: As per HPI otherwise 10 point review of systems negative.   Past Medical History:  Diagnosis Date   Anxiety    Breast cancer (HCC)    Bronchitis    Carotid stenosis    bilateral   Chronic cough    Depression    Diverticulitis    Diverticulosis    Environmental allergies    HOH (hard of hearing)     Hypercholesteremia    Hypertension    Hypothyroidism    Neuropathy    Osteoporosis    Palpitations    Sinus trouble    Tachycardia    Trigeminal neuralgia     Past Surgical History:  Procedure Laterality Date   ABDOMINAL HYSTERECTOMY     CATARACT EXTRACTION W/PHACO Right 05/03/2015   Procedure: CATARACT EXTRACTION PHACO AND INTRAOCULAR LENS PLACEMENT (IOC);  Surgeon: Galen Manila, MD;  Location: [facility] ORS;  Service: Ophthalmology;  Laterality: Right;  [place] 00:54 AP% 18.8 CDE 10.21 fluid pack lot # [id number]   CATARACT EXTRACTION W/PHACO Left 05/17/2015   Procedure: CATARACT EXTRACTION PHACO AND INTRAOCULAR LENS PLACEMENT (IOC);  Surgeon: Galen Manila, MD;  Location: [facility] ORS;  Service: Ophthalmology;  Laterality: Left;  [place] 01:00.5 AP% 17.8 CDE 10.76 fluid pack lot # [id number]   MASTECTOMY     THYROIDECTOMY, PARTIAL       reports that she has never smoked. She has never used smokeless tobacco. She reports that she does not drink alcohol. No history on file for drug use.  Allergies  Allergen Reactions   Acthib [Haemophilus B Polysacc Tetanus Toxoid Conj Vaccine]    Apap [Acetaminophen]    Banana Concentrate [Banana]    Benadryl [Diphenhydramine Hcl (Sleep)]    Bentyl [Dicyclomine Hcl]    Biaxin [Clarithromycin]    Codeine    Eryc [Erythromycin]    Etodolac    Flagyl [Metronidazole]    Fosamax [Alendronate Sodium]    Penicillins    Shellfish Allergy  Statins    Tetracyclines & Related    Tramadol    Vibramycin [Doxycycline Calcium]     FH Father died of MI   Prior to Admission medications   Medication Sig Start Date End Date Taking? Authorizing Provider  ascorbic acid (VITAMIN C) 500 MG tablet Take 500 mg by mouth daily.   Yes [provider]  aspirin 81 MG tablet Take 81 mg by mouth daily.   Yes [provider]  cholecalciferol (VITAMIN D3) 25 MCG (1000 UNIT) tablet Take 1,000 Units by mouth daily.   Yes [provider]  diltiazem  (CARDIZEM CD) 120 MG 24 hr capsule Take 120 mg by mouth daily. 03/28/22  Yes [provider]  isosorbide mononitrate (IMDUR) 30 MG 24 hr tablet Take 30 mg by mouth daily. 05/20/22  Yes [provider]  levothyroxine (SYNTHROID, LEVOTHROID) 75 MCG tablet Take 75 mcg by mouth daily before breakfast.   Yes [provider]  loratadine (CLARITIN) 10 MG tablet Take 10 mg by mouth daily.   Yes [provider]  Multiple Vitamin (MULTIVITAMIN) tablet Take 1 tablet by mouth daily.   Yes [provider]  omeprazole (PRILOSEC) 10 MG capsule Take 10 mg by mouth daily.   Yes [provider]  amLODipine (NORVASC) 5 MG tablet Take 5 mg by mouth daily. Patient not taking: Reported on 05/20/2022    [provider]  calcium citrate-vitamin D (CITRACAL+D) 315-200 MG-UNIT per tablet Take 1 tablet by mouth 2 (two) times daily. Patient not taking: Reported on 05/20/2022    [provider]  naproxen sodium (ANAPROX) 220 MG tablet Take 220 mg by mouth 2 (two) times daily as needed. Patient not taking: Reported on 05/20/2022    [provider]  Vit C-Cholecalciferol-Rose Hip (VITAMIN C & D3/ROSE HIPS) [phone] MG-UNIT-MG CAPS Take by mouth. Patient not taking: Reported on 05/20/2022    [provider]    Physical Exam: Vitals:   05/20/22 1334 05/20/22 1728 05/20/22 1900 05/20/22 2000  BP: (!) 208/107 (!) 202/106 (!) 137/126 (!) 167/84  Pulse: (!) 119 82  82  Resp: 18 18 17 14   Temp: 98.5 F (36.9 C)     TempSrc: Oral     SpO2: 100% 100% 91% 100%  Weight: 52.6 kg     Height: 5\' 3"  (1.6 m)        Vitals:   05/20/22 1334 05/20/22 1728 05/20/22 1900 05/20/22 2000  BP: (!) 208/107 (!) 202/106 (!) 137/126 (!) 167/84  Pulse: (!) 119 82  82  Resp: 18 18 17 14   Temp: 98.5 F (36.9 C)     TempSrc: Oral     SpO2: 100% 100% 91% 100%  Weight: 52.6 kg     Height: 5\' 3"  (1.6 m)     Constitutional: NAD, calm, comfortable Eyes:  PERRL, lids and conjunctivae normal ENMT: Mucous membranes are moist. Posterior pharynx clear of any exudate or lesions.Normal dentition.  Neck: normal, supple, no masses, no thyromegaly Respiratory: clear to auscultation bilaterally, no wheezing, no crackles. Normal respiratory effort. No accessory muscle use.  Cardiovascular: Regular rate and rhythm, no murmurs / rubs / gallops. No extremity edema. 2+ pedal pulses.  Abdomen: no tenderness, no masses palpated. No hepatosplenomegaly. Bowel sounds positive.  Musculoskeletal: no clubbing / cyanosis. No joint deformity upper and lower extremities. Good ROM, no contractures. Normal muscle tone.  Skin: no rashes, lesions, ulcers. No induration Neurologic: CN 2-12 grossly intact. Sensation intact, Strength 5/5 in all 4.  Psychiatric: Normal judgment and insight. Alert and oriented x 3. Normal mood.    Labs on Admission: I have personally reviewed following labs and imaging studies  CBC: Recent Labs  Lab 05/20/22 1337  WBC 7.8  HGB 13.5  HCT 42.2  MCV 90.6  PLT 273   Basic Metabolic Panel: Recent Labs  Lab 05/20/22 1337  NA 135  K 3.8  CL 101  CO2 27  GLUCOSE 135*  BUN 23  CREATININE 0.92  CALCIUM 10.0   GFR: Estimated Creatinine Clearance: 32.9 mL/min (by C-G formula based on SCr of 0.92 mg/dL). Liver Function Tests: No results for input(s): "AST", "ALT", "ALKPHOS", "BILITOT", "PROT", "ALBUMIN" in the last 168 hours. No results for input(s): "LIPASE", "AMYLASE" in the last 168 hours. No results for input(s): "AMMONIA" in the last 168 hours. Coagulation Profile: No results for input(s): "INR", "PROTIME" in the last 168 hours. Cardiac Enzymes: No results for input(s): "CKTOTAL", "CKMB", "CKMBINDEX", "TROPONINI" in the last 168 hours. BNP (last 3 results) No results for input(s): "PROBNP" in the last 8760 hours. HbA1C: No results for input(s): "HGBA1C" in the last 72 hours. CBG: No results for input(s): "GLUCAP" in the last  168 hours. Lipid Profile: No results for input(s): "CHOL", "HDL", "LDLCALC", "TRIG", "CHOLHDL", "LDLDIRECT" in the last 72 hours. Thyroid Function Tests: Recent Labs    05/20/22 1337  TSH 2.998  FREET4 0.92   Anemia Panel: No results for input(s): "VITAMINB12", "FOLATE", "FERRITIN", "TIBC", "IRON", "RETICCTPCT" in the last 72 hours. Urine analysis:    Component Value Date/Time   COLORURINE STRAW (A) 07/29/2016 1155   APPEARANCEUR CLEAR (A) 07/29/2016 1155   LABSPEC 1.006 07/29/2016 1155   PHURINE 8.0 07/29/2016 1155   GLUCOSEU NEGATIVE 07/29/2016 1155   HGBUR NEGATIVE 07/29/2016 1155   BILIRUBINUR NEGATIVE 07/29/2016 1155   KETONESUR TRACE (A) 07/29/2016 1155   PROTEINUR NEGATIVE 07/29/2016 1155   NITRITE NEGATIVE 07/29/2016 1155   LEUKOCYTESUR NEGATIVE 07/29/2016 1155    Radiological Exams on Admission: CT Angio Chest PE W and/or Wo Contrast  Result Date: 05/20/2022 CLINICAL DATA:  Pulmonary embolism (PE) suspected, high prob Weakness and shortness of breath.  Substernal chest pressure. EXAM: CT ANGIOGRAPHY CHEST WITH CONTRAST TECHNIQUE: Multidetector CT imaging of the chest was performed using the standard protocol during bolus administration of intravenous contrast. Multiplanar CT image reconstructions and MIPs were obtained to evaluate the vascular anatomy. RADIATION DOSE REDUCTION: This exam was performed according to the departmental dose-optimization program which includes automated exposure control, adjustment of the mA and/or kV according to patient size and/or use of iterative reconstruction technique. CONTRAST:  75mL OMNIPAQUE IOHEXOL 350 MG/ML SOLN COMPARISON:  Radiograph earlier today.  Chest CTA 01/09/2019 FINDINGS: Cardiovascular: There are no filling defects within the pulmonary arteries to suggest pulmonary embolus. Breathing motion artifact in the lower lobes limits subsegmental basilar assessment. Atherosclerosis of the thoracic aorta. Cannot assess for dissection  given phase of contrast tailored to pulmonary arteries ses mint. The heart is normal in size. There is minimal contrast refluxing into the hepatic veins. No pericardial effusion. Occasional coronary artery calcifications. Mediastinum/Nodes: No mediastinal or hilar adenopathy. No esophageal wall thickening. No visualized thyroid nodule Lungs/Pleura: Chronic biapical pleuroparenchymal scarring and upper lobe bronchiectasis. No acute airspace disease. No features of pulmonary edema. No pleural fluid. Upper Abdomen: Calcified granuloma in the spleen. Musculoskeletal: There are no acute or suspicious osseous abnormalities. Review of the MIP images confirms the above findings. IMPRESSION: 1. No pulmonary embolus. 2. Chronic biapical pleuroparenchymal scarring and  upper lobe bronchiectasis. 3. Minimal contrast refluxing into the hepatic veins, can be seen with elevated right heart pressures. Aortic Atherosclerosis (ICD10-I70.0). Electronically Signed   By: Narda Rutherford M.D.   On: 05/20/2022 19:40   DG Chest 2 View  Result Date: 05/20/2022 CLINICAL DATA:  Chest pain EXAM: CHEST - 2 VIEW COMPARISON:  01/09/2019 FINDINGS: The heart size and mediastinal contours are within normal limits. Aortic atherosclerosis. Chronic biapical pleuroparenchymal scarring, right greater than left. No focal airspace consolidation, pleural effusion, or pneumothorax. The visualized skeletal structures are unremarkable. IMPRESSION: No active cardiopulmonary disease. Electronically Signed   By: Duanne Guess D.O.   On: 05/20/2022 14:08    EKG: Independently reviewed. See above  Assessment/Plan Chest pain -admit to tele  -? Related to uncontrolled blood pressure  less likely acs  -CE + though to be due to demand  -will continue to cycle CE, update echo  -risk stratification in am if bp controlled  -cardiology consult in am    Uncontrolled HTN /HTN Urgency  -resume home regimen  - prn labetolol  -of note one mo ago cardizem  was decreased from 180 to 120 due to patient having complaints of weakness and fatigue. -will add arb   SVT Inappropriate sinus tachycardia  -continue with ccb  -await cardiology recs re further management    HTN -continue norvasc, imdur, cardizem   HLD -diet controlled    Depression/ Anxiety    Hypothyroidism -continue synthroid    BRCA  -no active issues   b/l carotid A stenosis -continue on asa  -followed by cardiology     DVT prophylaxis: heparin Code Status: full Family Communication: son at bedside Disposition Plan: patient  expected to be admitted greater than 2 midnights  Consults called: consider cardiology consult in am  Admission status: med tele   Lurline Del MD [facility]   If 7PM-7AM, please contact night-coverage [email or web address] Password [facility]  05/20/2022, 8:58 PM

## 2022-05-21 ENCOUNTER — Observation Stay
Admit: 2022-05-21 | Discharge: 2022-05-21 | Disposition: A | Discharge status: Home / Self Care | Payer: Medicare HMO | Attending: Cardiology | Admitting: Cardiology

## 2022-05-21 ENCOUNTER — Other Ambulatory Visit: Payer: PRIVATE HEALTH INSURANCE

## 2022-05-21 DIAGNOSIS — I10 Essential (primary) hypertension: Secondary | ICD-10-CM | POA: Diagnosis not present

## 2022-05-21 DIAGNOSIS — I200 Unstable angina: Secondary | ICD-10-CM | POA: Diagnosis not present

## 2022-05-21 DIAGNOSIS — R079 Chest pain, unspecified: Secondary | ICD-10-CM | POA: Diagnosis not present

## 2022-05-21 LAB — ECHOCARDIOGRAM COMPLETE
AR max vel: 2.63 cm2
AV Area VTI: 3.87 cm2
AV Area mean vel: 2.67 cm2
AV Mean grad: 2.5 mmHg
AV Peak grad: 4.3 mmHg
Ao pk vel: 1.04 m/s
Area-P 1/2: 2.84 cm2
Height: 63 in
S' Lateral: 1.8 cm
Weight: 1856 oz

## 2022-05-21 LAB — LIPID PANEL
Cholesterol: 248 mg/dL — ABNORMAL HIGH (ref 0–200)
HDL: 41 mg/dL (ref >40)
LDL Cholesterol: 179 mg/dL — ABNORMAL HIGH (ref 0–99)
Total CHOL/HDL Ratio: 6 RATIO
Triglycerides: 140 mg/dL (ref <150)
VLDL: 28 mg/dL (ref 0–40)

## 2022-05-21 LAB — TROPONIN I (HIGH SENSITIVITY)
Troponin I (High Sensitivity): 38 ng/L — ABNORMAL HIGH (ref <18)
Troponin I (High Sensitivity): 42 ng/L — ABNORMAL HIGH (ref <18)

## 2022-05-21 MED ORDER — PREGABALIN 75 MG PO CAPS: 75.0000 mg | ORAL_CAPSULE | Freq: Once | ORAL | Status: DC

## 2022-05-21 MED ORDER — EZETIMIBE 10 MG PO TABS: 10.0000 mg | ORAL_TABLET | Freq: Every day | ORAL | 0 refills | Status: DC

## 2022-05-21 MED ORDER — ISOSORBIDE MONONITRATE ER 60 MG PO TB24: 60.0000 mg | ORAL_TABLET | Freq: Every day | ORAL | 0 refills | Status: DC

## 2022-05-21 MED ORDER — ISOSORBIDE MONONITRATE ER 30 MG PO TB24
30.0000 mg | ORAL_TABLET | Freq: Once | ORAL | Status: AC
Start: 2022-05-21 — End: 2022-05-21
  Administered 2022-05-21: 30 mg via ORAL

## 2022-05-21 MED ORDER — NITROGLYCERIN 0.3 MG SL SUBL: 0.3000 mg | SUBLINGUAL_TABLET | SUBLINGUAL | 0 refills | Status: DC | PRN

## 2022-05-21 MED ORDER — ISOSORBIDE MONONITRATE ER 60 MG PO TB24: 60.0000 mg | ORAL_TABLET | Freq: Every day | ORAL | Status: DC

## 2022-05-21 MED ORDER — IRBESARTAN 75 MG PO TABS
37.5000 mg | ORAL_TABLET | Freq: Every day | ORAL | Status: DC
  Administered 2022-05-21: 37.5 mg via ORAL
  Filled 2022-05-21: qty 0.5

## 2022-05-21 MED ORDER — LABETALOL HCL 5 MG/ML IV SOLN
10.0000 mg | INTRAVENOUS | Status: DC | PRN
Start: 2022-05-21 — End: 2022-05-21
  Administered 2022-05-21: 10 mg via INTRAVENOUS
  Filled 2022-05-21: qty 4

## 2022-05-21 MED ORDER — IRBESARTAN 75 MG PO TABS: 37.5000 mg | ORAL_TABLET | Freq: Every day | ORAL | 0 refills | Status: DC

## 2022-05-21 NOTE — Consult Note (Signed)
[place] NOTE       Patient ID: Bonnie Barker MRN: [id number] DOB/AGE: Sep 13, 1929 88 y.o.  Admit date: 05/20/2022 Referring Physician Dr. Myles Rosenthal Primary Physician Dr. Caryl Comes Primary Cardiologist Dr. Nehemiah Massed Reason for Consultation chest pain  HPI: Bonnie Barker is a 19yoF with a PMH of hypertension, hyperlipidemia, atypical chest pain, bilateral breast cancer, anxiety who presented to [facility] ED 05/20/2022 with shortness of breath and chest tightness, worsening over the past couple weeks.  Cardiology is consulted for further assistance.  Patient says she has not been feeling well for a long time, complains of feeling very fatigued day and day out interfering with her ability to run errands, and do work around the house.  She says some days she has good days where she is able to do housework and run to the bank or post office and other days she feels that she has to lay in bed in order to feel somewhat better.  Her son and daughter at bedside notes that she is very independent and is able to perform all her ADLs unassisted, but some days she does have very low energy.  Over the past couple weeks she has had episodes of exertional shortness of breath and central chest tightness, worse than they have been in the past. The most recent time this occurred was when she presented to the ED and her blood pressure was notably with a systolic of 656.  She cannot really tell me if she this chest tightness every day, but notes that "she felt so bad at night 1 time this week and was worried so she called her son to come over to be with her and she felt better after that and was able to sleep.  Her son notes that the patient does not sleep very well or very much at night and the patient frequently notes her energy has just been low which seems to bother her most.  She was recently seen by her regular cardiologist 2 months ago where her diltiazem dose was decreased from 180 to  120 mg out of concern for medication side effect of fatigue and during that visit her chest pressure was seemingly occurring randomly in addition to her chronic fatigue.  She does have some positional dizziness when going from sitting to standing, without palpitations, lower extremity edema, orthopnea. Her most recent [place] was July 2021 which showed normal myocardial perfusion without evidence of myocardial ischemia, her most recent echo from 01/2019 showed an EF of 50 to 55% with mild LVH without valvular stenosis and mild MR, TR, PR.  She has never had a heart catheterization before.  Recent vitals are notable for a blood pressure of 118/68, heart rate 70 in sinus rhythm on telemetry.  Labs are notable for potassium of 3.8, creatinine 0.92, GFR 59.  High-sensitivity troponin 04-11-41-38 H&H normal at 13/42 platelets 273.  CT angio without pulmonary embolus with chronic biapical pleural-parenchymal scarring and upper lobe bronchiectasis with normal contrast reflux into the hepatic veins seen with elevated right heart pressures.  Chest x-ray without active cardiopulmonary disease.  Review of systems complete and found to be negative unless listed above     Past Medical History:  Diagnosis Date   Anxiety    Breast cancer ([place])    Bronchitis    Carotid stenosis    bilateral   Chronic cough    Depression    Diverticulitis    Diverticulosis    Environmental allergies  HOH (hard of hearing)    Hypercholesteremia    Hypertension    Hypothyroidism    Neuropathy    Osteoporosis    Palpitations    Sinus trouble    Tachycardia    Trigeminal neuralgia     Past Surgical History:  Procedure Laterality Date   ABDOMINAL HYSTERECTOMY     CATARACT EXTRACTION W/PHACO Right 05/03/2015   Procedure: CATARACT EXTRACTION PHACO AND INTRAOCULAR LENS PLACEMENT (IOC);  Surgeon: Birder Robson, MD;  Location: [facility] ORS;  Service: Ophthalmology;  Laterality: Right;  [place] 00:54 AP% 18.8 CDE  10.21 fluid pack lot # [id number]   CATARACT EXTRACTION W/PHACO Left 05/17/2015   Procedure: CATARACT EXTRACTION PHACO AND INTRAOCULAR LENS PLACEMENT (IOC);  Surgeon: Birder Robson, MD;  Location: [facility] ORS;  Service: Ophthalmology;  Laterality: Left;  [place] 01:00.5 AP% 17.8 CDE 10.76 fluid pack lot # [id number]   MASTECTOMY     THYROIDECTOMY, PARTIAL      Medications Prior to Admission  Medication Sig Dispense Refill Last Dose   ascorbic acid (VITAMIN C) 500 MG tablet Take 500 mg by mouth daily.   05/20/2022   aspirin 81 MG tablet Take 81 mg by mouth daily.   05/20/2022   cholecalciferol (VITAMIN D3) 25 MCG (1000 UNIT) tablet Take 1,000 Units by mouth daily.   05/20/2022   diltiazem (CARDIZEM CD) 120 MG 24 hr capsule Take 120 mg by mouth daily.   05/20/2022   isosorbide mononitrate (IMDUR) 30 MG 24 hr tablet Take 30 mg by mouth daily.   05/20/2022   levothyroxine (SYNTHROID, LEVOTHROID) 75 MCG tablet Take 75 mcg by mouth daily before breakfast.   05/20/2022   loratadine (CLARITIN) 10 MG tablet Take 10 mg by mouth daily.   05/20/2022   Multiple Vitamin (MULTIVITAMIN) tablet Take 1 tablet by mouth daily.   05/20/2022   omeprazole (PRILOSEC) 10 MG capsule Take 10 mg by mouth daily.   05/20/2022   amLODipine (NORVASC) 5 MG tablet Take 5 mg by mouth daily. (Patient not taking: Reported on 05/20/2022)   Not Taking   calcium citrate-vitamin D (CITRACAL+D) 315-200 MG-UNIT per tablet Take 1 tablet by mouth 2 (two) times daily. (Patient not taking: Reported on 05/20/2022)   Not Taking   naproxen sodium (ANAPROX) 220 MG tablet Take 220 mg by mouth 2 (two) times daily as needed. (Patient not taking: Reported on 05/20/2022)   Not Taking   Vit C-Cholecalciferol-Rose Hip (VITAMIN C & D3/ROSE HIPS) [phone] MG-UNIT-MG CAPS Take by mouth. (Patient not taking: Reported on 05/20/2022)   Not Taking   Social History   Socioeconomic History   Marital status: Single    Spouse name: Not on file   Number of children: Not on  file   Years of education: Not on file   Highest education level: Not on file  Occupational History   Not on file  Tobacco Use   Smoking status: Never   Smokeless tobacco: Never  Substance and Sexual Activity   Alcohol use: No   Drug use: Not on file   Sexual activity: Never  Other Topics Concern   Not on file  Social History Narrative   Not on file   Social Determinants of Health   Financial Resource Strain: Not on file  Food Insecurity: Not on file  Transportation Needs: Not on file  Physical Activity: Not on file  Stress: Not on file  Social Connections: Not on file  Intimate Partner Violence: Not on file    No family  history on file.    PHYSICAL EXAM General: Elderly and conversational Caucasian female, well nourished, in no acute distress.  Sitting upright in PCU bed with son and daughter at bedside HEENT:  Normocephalic and atraumatic. Neck:  No JVD.  Lungs: Normal respiratory effort on room air. Clear bilaterally to auscultation. No wheezes, crackles, rhonchi.  Heart: HRRR . Normal S1 and S2 without gallops or murmurs.  Abdomen: Non-distended appearing.  Msk: Normal strength and tone for age. Extremities: Warm and well perfused. No clubbing, cyanosis.  No peripheral edema.  Neuro: Alert and oriented X 3. Psych:  Answers questions appropriately.   Labs:   Lab Results  Component Value Date   WBC 7.8 05/20/2022   HGB 13.5 05/20/2022   HCT 42.2 05/20/2022   MCV 90.6 05/20/2022   PLT 273 05/20/2022    Recent Labs  Lab 05/20/22 1337  NA 135  K 3.8  CL 101  CO2 27  BUN 23  CREATININE 0.92  CALCIUM 10.0  GLUCOSE 135*   Lab Results  Component Value Date   TROPONINI <0.03 01/09/2019    Lab Results  Component Value Date   CHOL 248 (H) 05/21/2022   Lab Results  Component Value Date   HDL 41 05/21/2022   Lab Results  Component Value Date   LDLCALC 179 (H) 05/21/2022   Lab Results  Component Value Date   TRIG 140 05/21/2022   Lab Results   Component Value Date   CHOLHDL 6.0 05/21/2022   No results found for: "LDLDIRECT"    Radiology: CT Angio Chest PE W and/or Wo Contrast  Result Date: 05/20/2022 CLINICAL DATA:  Pulmonary embolism (PE) suspected, high prob Weakness and shortness of breath.  Substernal chest pressure. EXAM: CT ANGIOGRAPHY CHEST WITH CONTRAST TECHNIQUE: Multidetector CT imaging of the chest was performed using the standard protocol during bolus administration of intravenous contrast. Multiplanar CT image reconstructions and MIPs were obtained to evaluate the vascular anatomy. RADIATION DOSE REDUCTION: This exam was performed according to the departmental dose-optimization program which includes automated exposure control, adjustment of the mA and/or kV according to patient size and/or use of iterative reconstruction technique. CONTRAST:  7m OMNIPAQUE IOHEXOL 350 MG/ML SOLN COMPARISON:  Radiograph earlier today.  Chest CTA 01/09/2019 FINDINGS: Cardiovascular: There are no filling defects within the pulmonary arteries to suggest pulmonary embolus. Breathing motion artifact in the lower lobes limits subsegmental basilar assessment. Atherosclerosis of the thoracic aorta. Cannot assess for dissection given phase of contrast tailored to pulmonary arteries ses mint. The heart is normal in size. There is minimal contrast refluxing into the hepatic veins. No pericardial effusion. Occasional coronary artery calcifications. Mediastinum/Nodes: No mediastinal or hilar adenopathy. No esophageal wall thickening. No visualized thyroid nodule Lungs/Pleura: Chronic biapical pleuroparenchymal scarring and upper lobe bronchiectasis. No acute airspace disease. No features of pulmonary edema. No pleural fluid. Upper Abdomen: Calcified granuloma in the spleen. Musculoskeletal: There are no acute or suspicious osseous abnormalities. Review of the MIP images confirms the above findings. IMPRESSION: 1. No pulmonary embolus. 2. Chronic biapical  pleuroparenchymal scarring and upper lobe bronchiectasis. 3. Minimal contrast refluxing into the hepatic veins, can be seen with elevated right heart pressures. Aortic Atherosclerosis (ICD10-I70.0). Electronically Signed   By: MKeith RakeM.D.   On: 05/20/2022 19:40   DG Chest 2 View  Result Date: 05/20/2022 CLINICAL DATA:  Chest pain EXAM: CHEST - 2 VIEW COMPARISON:  01/09/2019 FINDINGS: The heart size and mediastinal contours are within normal limits. Aortic atherosclerosis. Chronic biapical pleuroparenchymal  scarring, right greater than left. No focal airspace consolidation, pleural effusion, or pneumothorax. The visualized skeletal structures are unremarkable. IMPRESSION: No active cardiopulmonary disease. Electronically Signed   By: Davina Poke D.O.   On: 05/20/2022 14:08    05/24/2020 Impression  Normal Lexiscan infusion EKG  Normal myocardial perfusion without evidence of myocardial ischemia   Serafina Royals Narrative  [place]  [place], [place], [place]  [address]  [phone]   Procedure: Pharmacologic Myocardial Perfusion Imaging    ONE day procedure   Indication: Stable angina pectoris (CMS-HCC)  Plan: NM myocardial perfusion SPECT multiple (stress        and rest), ECG stress test only   Ordering Physician:   Dr. Serafina Royals    Clinical History:  88 y.o. year old female  Vitals: Height: 1 in Weight: 126 lb  Cardiac risk factors include:     CAS, Hyperlipidemia and HTN    Procedure:   Pharmacologic stress testing was performed with Regadenoson using a single  use 0.'4mg'$ /38m (0.08 mg/ml) prefilled syringe intravenously infused as a  bolus dose over 10-15 seconds. The stress test was stopped due to Infusion  completion.  Blood pressure response was hypertensive. The patient  experienced exaggerated heart rate and blood pressure response with  diaphoresis..Marland Kitchen  Rest HR: 129bpm  Rest  BP: 160/883mg  Max HR: 162bpm  Min BP: 200/9881m   Stress Test Administered by: DINOswald HillockMA   ECG Interpretation:  Rest ECG:  normal sinus rhythm, none  Stress ECG:  normal sinus rhythm,    Recovery ECG:  normal sinus rhythm  ECG Interpretation:  non-diagnostic due to pharmacologic testing.    Administrations This Visit     regadenoson (LEXISCAN) 0.4 mg/5 mL inj syringe 0.4 mg     Admin Date  05/19/2020 Action  Given Dose  0.4 mg Route  Intravenous Administered By  GoaHerbert SetaNMT     technetium Tc99m19mtamibi (CARDIOLITE) injection 10.616.38licurie     Admin Date  05/19/2020 Action  Given Dose  10.646.65licurie Route  Intravenous Administered By  GoarHerbert SetaMT     technetium Tc99m 62mamibi (CARDIOLITE) injection 29.7799.35icurie     Admin Date  05/19/2020 Action  Given Dose  29.7770.17icurie Route  Intravenous Administered By  GoardHerbert SetaT   Gated post-stress perfusion imaging was performed 30 minutes after stress.  Rest images were performed 30 minutes after injection.   Gated LV Analysis:   Summary of LV Perfusion: Normal,   Summary of LV Function: Normal    TID Ratio: 1.01   LVEF= 74%   FINDINGS:  Regional wall motion:  reveals normal myocardial thickening and wall  motion.  The overall quality of the study is good.    Artifacts noted: no  Left ventricular cavity: normal.   Perfusion Analysis:  SPECT images demonstrate homogeneous tracer  distribution throughout the myocardium. Defect type : Normal    ECHO 02/02/2019 ECHOCARDIOGRAPHIC MEASUREMENTS  2D DIMENSIONS  AORTA                  Values   Normal Range   MAIN PA         Values    Normal Range                Annulus: 1.7 cm       [2.1-2.5]         PA  Main: nm*       [1.5-2.1]              Aorta Sin: 2.7 cm       [2.7-3.3]    RIGHT VENTRICLE            ST Junction: nm*          [2.3-2.9]         RV Base: nm*       [<4.2]              Asc.Aorta: nm*           [2.3-3.1]          RV Mid: 2.1 cm    [<3.5]  LEFT VENTRICLE                                      RV Length: nm*       [<8.6]                  LVIDd: 4.0 cm       [3.9-5.3]    INFERIOR VENA CAVA                  LVIDs: 3.0 cm                        Max. IVC: nm*       [<=2.1]                     FS: 25.3 %       [>25]            Min. IVC: nm*                    SWT: 1.2 cm       [0.5-0.9]    ------------------                    PWT: 1.1 cm       [0.5-0.9]    nm* - not measured  LEFT ATRIUM                LA Diam: 3.4 cm       [2.7-3.8]            LA A4C Area: nm*          [<20]              LA Volume: nm*          [22-52]  _________________________________________________________________________________________  ECHOCARDIOGRAPHIC DESCRIPTIONS  AORTIC ROOT                   Size: Normal             Dissection: INDETERM FOR DISSECTION  AORTIC VALVE               Leaflets: Tricuspid                   Morphology: MILDLY THICKENED               Mobility: Fully mobile  LEFT VENTRICLE                   Size: Normal                        Anterior:  Normal            Contraction: Normal                         Lateral: Normal             Closest EF: 50% (Estimated)                 Septal: Normal              LV Masses: No Masses                       Apical: Normal                    LVH: MILD LVH                      Inferior: Normal                                                      Posterior: Normal           Dias.FxClass: N/A  MITRAL VALVE               Leaflets: Normal                        Mobility: Fully mobile             Morphology: Normal  LEFT ATRIUM                   Size: Normal                       LA Masses: No masses              IA Septum: Normal IAS  MAIN PA                   Size: Normal  PULMONIC VALVE             Morphology: Normal                        Mobility: Fully mobile  RIGHT VENTRICLE              RV Masses: No Masses                         Size: Normal               Free Wall: Normal                     Contraction: Normal  TRICUSPID VALVE               Leaflets: Normal                        Mobility: Fully mobile             Morphology: Normal  RIGHT ATRIUM                   Size: Normal  RA Other: None                RA Mass: No masses  PERICARDIUM                  Fluid: No effusion  INFERIOR VENACAVA                   Size: Normal Normal respiratory collapse  _________________________________________________________________________________________   DOPPLER ECHO and OTHER SPECIAL PROCEDURES                 Aortic: No AR                      No AS                         116.2 cm/sec peak vel      5.4 mmHg peak grad                 Mitral: MILD MR                    No MS                         5.4 cm^2 by DOPPLER                         MV Inflow E Vel = 72.0 cm/sec     MV Annulus E'Vel = 6.7 cm/sec                         E/E'Ratio = 10.7              Tricuspid: MILD TR                    No TS                         221.3 cm/sec peak TR vel   22.6 mmHg peak RV pressure              Pulmonary: MILD PR                    No PS                         82.8 cm/sec peak vel       2.7 mmHg peak grad  _________________________________________________________________________________________  INTERPRETATION  NORMAL LEFT VENTRICULAR SYSTOLIC FUNCTION   WITH MILD LVH  NORMAL RIGHT VENTRICULAR SYSTOLIC FUNCTION  MILD VALVULAR REGURGITATION (See above)  NO VALVULAR STENOSIS  MILD MR, TR, PR  EF 50-55%  _________________________________________________________________________________________  Electronically signed by      MD Serafina Royals on 02/02/2019 11: 45 AM           Performed By: Maurilio Lovely, RDCS     Ordering Physician: Serafina Royals  _________________________________________________________________________________________ Procedure Note  Interface, Text Results In - 02/02/2019 11:49 AM EDT   Formatting of this note might be different from the original.                         [place]                 Basher, Dailee  [facility]                                    [id number]            [place] #: [id number]            [place], [place], [place] [address]       Date: 02/02/2019 10: 06 AM                                                               Adult   Female   Age: 11 yrs            ECHOCARDIOGRAM REPORT                              Outpatient                                                               [facility]       STUDY:CHEST WALL               TAPE:                    MD1: KOWALSKI, BRUCE JAY        ECHO:Yes    DOPPLER:Yes       FILE:                    BP: 124/74 mmHg       COLOR:Yes   CONTRAST:No     MACHINE:Philips   RV BIOPSY:No          3D:No  SOUND QLTY:Moderate            Height: 64 in      MEDIUM:None                                              Weight: 130 lb                                                               BSA: 1.6 m2  _________________________________________________________________________________________                HISTORY: DOE                 REASON: Assess, LV function             INDICATION: SOBOE (shortness of breath on exertion) [R06.02 (ICD-10-CM)]  _________________________________________________________________________________________  ECHOCARDIOGRAPHIC MEASUREMENTS  2D DIMENSIONS  AORTA                  Values   Normal Range   MAIN PA         Values    Normal Range                Annulus: 1.7 cm       [2.1-2.5]         PA Main: nm*       [1.5-2.1]              Aorta Sin: 2.7 cm       [2.7-3.3]    RIGHT VENTRICLE            ST Junction: nm*          [2.3-2.9]         RV Base: nm*       [<4.2]              Asc.Aorta: nm*          [2.3-3.1]          RV Mid: 2.1 cm    [<3.5]  LEFT VENTRICLE                                      RV Length: nm*        [<8.6]                  LVIDd: 4.0 cm       [3.9-5.3]    INFERIOR VENA CAVA                  LVIDs: 3.0 cm                        Max. IVC: nm*       [<=2.1]                     FS: 25.3 %       [>25]            Min. IVC: nm*                    SWT: 1.2 cm       [0.5-0.9]    ------------------                    PWT: 1.1 cm       [0.5-0.9]    nm* - not measured  LEFT ATRIUM                LA Diam: 3.4 cm       [2.7-3.8]            LA A4C Area: nm*          [<20]              LA Volume: nm*          [22-52]  _________________________________________________________________________________________  ECHOCARDIOGRAPHIC DESCRIPTIONS  AORTIC ROOT                   Size: Normal             Dissection: INDETERM FOR DISSECTION  AORTIC VALVE               Leaflets: Tricuspid  Morphology: MILDLY THICKENED               Mobility: Fully mobile  LEFT VENTRICLE                   Size: Normal                        Anterior: Normal            Contraction: Normal                         Lateral: Normal             Closest EF: 50% (Estimated)                 Septal: Normal              LV Masses: No Masses                       Apical: Normal                    LVH: MILD LVH                      Inferior: Normal                                                      Posterior: Normal           Dias.FxClass: N/A  MITRAL VALVE               Leaflets: Normal                        Mobility: Fully mobile             Morphology: Normal  LEFT ATRIUM                   Size: Normal                       LA Masses: No masses              IA Septum: Normal IAS  MAIN PA                   Size: Normal  PULMONIC VALVE             Morphology: Normal                        Mobility: Fully mobile  RIGHT VENTRICLE              RV Masses: No Masses                         Size: Normal              Free Wall: Normal                     Contraction: Normal  TRICUSPID VALVE               Leaflets:  Normal  Mobility: Fully mobile             Morphology: Normal  RIGHT ATRIUM                   Size: Normal                        RA Other: None                RA Mass: No masses  PERICARDIUM                  Fluid: No effusion  INFERIOR VENACAVA                   Size: Normal Normal respiratory collapse  _________________________________________________________________________________________   DOPPLER ECHO and OTHER SPECIAL PROCEDURES                 Aortic: No AR                      No AS                         116.2 cm/sec peak vel      5.4 mmHg peak grad                 Mitral: MILD MR                    No MS                         5.4 cm^2 by DOPPLER                         MV Inflow E Vel = 72.0 cm/sec     MV Annulus E'Vel = 6.7 cm/sec                         E/E'Ratio = 10.7              Tricuspid: MILD TR                    No TS                         221.3 cm/sec peak TR vel   22.6 mmHg peak RV pressure              Pulmonary: MILD PR                    No PS                         82.8 cm/sec peak vel       2.7 mmHg peak grad  _________________________________________________________________________________________  INTERPRETATION  NORMAL LEFT VENTRICULAR SYSTOLIC FUNCTION   WITH MILD LVH  NORMAL RIGHT VENTRICULAR SYSTOLIC FUNCTION  MILD VALVULAR REGURGITATION (See above)  NO VALVULAR STENOSIS  MILD MR, TR, PR  EF 50-55%   TELEMETRY reviewed by me: Sinus rhythm 60s to 70s  EKG reviewed by me: Sinus tachycardia heart rate 119  ASSESSMENT AND PLAN:  Brigitte Soderberg is a 40yoF with a PMH of hypertension, hyperlipidemia, atypical chest pain, bilateral breast cancer, anxiety who presented to [facility] ED 05/20/2022 with shortness of breath and  chest tightness, worsening over the past couple weeks.  Cardiology is consulted for further assistance.  #Hypertensive urgency #Chest tightness / shortness of breath Patient presents with ongoing fatigue for  months and more recently occasional episodes of of shortness of breath with central chest tightness that occur with exertion and resolve with rest without associated nausea, diaphoresis or radiation. She can't quantify the amount of times these episodes happen, nor if they happen every day, but mostly notes persistent fatigue and low energy every day which is very bothersome for her. Her BP was 208/100 on presentation and troponin peaked at 42, likely related to her severely elevated BP and not ACS. Her EKG is without acute changes suggestive of ischemia.  -S/p 325 mg aspirin, continue 81 mg aspirin daily -Reports intolerance to statin therapy, unclear of which ones she has tried before -Continue antianginal and antihypertensive diltiazem CD 120 mg, increase isosorbide to 60 mg daily, and Irbesartan 37.'5mg'$  daily -Recommend keeping a blood pressure diary, changing positions slowly and taking breaks with activity throughout the day -Recommend close follow-up with Dr. Nehemiah Massed as an outpatient for consideration of echocardiogram versus stress testing, she strongly prefers to not undergo a repeat Lexiscan Myoview at this time due to significant anxiety surrounding this procedure in the past and we think this is reasonable.  This patient's plan of care was discussed and created with Dr. Clayborn Bigness and he is in agreement.  Signed: Tristan Schroeder , PA-C 05/21/2022, 9:56 AM [facility]

## 2022-05-21 NOTE — Progress Notes (Signed)
*  PRELIMINARY RESULTS* Echocardiogram 2D Echocardiogram has been performed.  Sherrie Sport 05/21/2022, 2:20 PM

## 2022-05-21 NOTE — Discharge Summary (Signed)
Physician Discharge Summary   Patient: Bonnie Barker MRN: [id number] DOB: 1928/11/08  Admit date:     05/20/2022  Discharge date: 05/21/22  Discharge Physician: Sharen Hones   PCP: Maryland Pink, MD   Recommendations at discharge:   Follow-up with PCP in 1 week. Follow-up with cardiology as scheduled.  Discharge Diagnoses: Principal Problem:   Chest pain Active Problems:   Unstable angina [facility])   Essential hypertension  Resolved Problems:   * No resolved hospital problems. *  Hospital Course:  Bonnie Barker is a 88 y.o. female with medical history significant of  SVT, HLD,HTN ,Depression , Anxiety , Hypothyroidism, BRCA , b/l carotid A stenosis who presents with intermittent episode of chest pain over the last few weeks associated with fatigue and sob.  Her symptoms are consistent with unstable angina, troponin minimally elevated at 42 and came down to 38.  Discussed with Dr.Callwood, he has no plan for inpatient work-up.  But he has adjusted patient medication to increase Imdur to 60 mg daily, her LDL was 179, patient does not tolerate a statin, I added Zetia to her regimen.  I also prescribed nitroglycerin as needed. At this point, patient is medically stable to be discharged.  Assessment and Plan: Unstable angina. Minimal elevated troponin secondary to angina. Essential hypertension. Condition has improved, at this point, patient is medically stable to be discharged.  Patient will have echocardiogram performed before discharge, results will be followed by Dr. Clayborn Bigness in the office.          Consultants: Cardiology Procedures performed: None  Disposition: Home Diet recommendation:  Discharge Diet Orders (From admission, onward)     Start     Ordered   05/21/22 0000  Diet - low sodium heart healthy        05/21/22 1200           Cardiac diet DISCHARGE MEDICATION: Allergies as of 05/21/2022       Reactions   Acthib [haemophilus B Polysacc Tetanus  Toxoid Conj Vaccine]    Apap [acetaminophen]    Banana Concentrate [banana]    Benadryl [diphenhydramine Hcl (sleep)]    Bentyl [dicyclomine Hcl]    Biaxin [clarithromycin]    Codeine    Eryc [erythromycin]    Etodolac    Flagyl [metronidazole]    Fosamax [alendronate Sodium]    Penicillins    Shellfish Allergy    Statins    Tetracyclines & Related    Tramadol    Vibramycin [doxycycline Calcium]         Medication List     STOP taking these medications    amLODipine 5 MG tablet Commonly known as: NORVASC   calcium citrate-vitamin D 315-200 MG-UNIT tablet Commonly known as: CITRACAL+D   diltiazem 120 MG 24 hr capsule Commonly known as: CARDIZEM CD   naproxen sodium 220 MG tablet Commonly known as: ALEVE   Vitamin C & D3/Rose Hips [phone] MG-UNIT-MG Caps Generic drug: Vit C-Cholecalciferol-Rose Hip       TAKE these medications    ascorbic acid 500 MG tablet Commonly known as: VITAMIN C Take 500 mg by mouth daily.   aspirin 81 MG tablet Take 81 mg by mouth daily.   cholecalciferol 25 MCG (1000 UNIT) tablet Commonly known as: VITAMIN D3 Take 1,000 Units by mouth daily.   ezetimibe 10 MG tablet Commonly known as: Zetia Take 1 tablet (10 mg total) by mouth daily.   irbesartan 75 MG tablet Commonly known as: AVAPRO Take 0.5 tablets (  37.5 mg total) by mouth daily. Start taking on: May 22, 2022   isosorbide mononitrate 60 MG 24 hr tablet Commonly known as: IMDUR Take 1 tablet (60 mg total) by mouth daily. Start taking on: May 22, 2022 What changed:  medication strength how much to take   levothyroxine 75 MCG tablet Commonly known as: SYNTHROID Take 75 mcg by mouth daily before breakfast.   loratadine 10 MG tablet Commonly known as: CLARITIN Take 10 mg by mouth daily.   multivitamin tablet Take 1 tablet by mouth daily.   nitroGLYCERIN 0.3 MG SL tablet Commonly known as: Nitrostat Place 1 tablet (0.3 mg total) under the tongue every 5  (five) minutes as needed for chest pain.   omeprazole 10 MG capsule Commonly known as: PRILOSEC Take 10 mg by mouth daily.        Follow-up Information     Corey Skains, MD. Go in 1 week(s).   Specialty: Cardiology Contact information: [facility] [place] [address] [phone]         Maryland Pink, MD Follow up in 1 week(s).   Specialty: Family Medicine Contact information: [address] [place] [address] [phone]                Discharge Exam: Danley Danker Weights   05/20/22 1334  Weight: 52.6 kg   General exam: Appears calm and comfortable  Respiratory system: Clear to auscultation. Respiratory effort normal. Cardiovascular system: S1 & S2 heard, RRR. No JVD, murmurs, rubs, gallops or clicks. No pedal edema. Gastrointestinal system: Abdomen is nondistended, soft and nontender. No organomegaly or masses felt. Normal bowel sounds heard. Central nervous system: Alert and oriented. No focal neurological deficits. Extremities: Symmetric 5 x 5 power. Skin: No rashes, lesions or ulcers Psychiatry: Judgement and insight appear normal. Mood & affect appropriate.    Condition at discharge: good  The results of significant diagnostics from this hospitalization (including imaging, microbiology, ancillary and laboratory) are listed below for reference.   Imaging Studies: CT Angio Chest PE W and/or Wo Contrast  Result Date: 05/20/2022 CLINICAL DATA:  Pulmonary embolism (PE) suspected, high prob Weakness and shortness of breath.  Substernal chest pressure. EXAM: CT ANGIOGRAPHY CHEST WITH CONTRAST TECHNIQUE: Multidetector CT imaging of the chest was performed using the standard protocol during bolus administration of intravenous contrast. Multiplanar CT image reconstructions and MIPs were obtained to evaluate the vascular anatomy. RADIATION DOSE REDUCTION: This exam was performed according to the  departmental dose-optimization program which includes automated exposure control, adjustment of the mA and/or kV according to patient size and/or use of iterative reconstruction technique. CONTRAST:  6mL OMNIPAQUE IOHEXOL 350 MG/ML SOLN COMPARISON:  Radiograph earlier today.  Chest CTA 01/09/2019 FINDINGS: Cardiovascular: There are no filling defects within the pulmonary arteries to suggest pulmonary embolus. Breathing motion artifact in the lower lobes limits subsegmental basilar assessment. Atherosclerosis of the thoracic aorta. Cannot assess for dissection given phase of contrast tailored to pulmonary arteries ses mint. The heart is normal in size. There is minimal contrast refluxing into the hepatic veins. No pericardial effusion. Occasional coronary artery calcifications. Mediastinum/Nodes: No mediastinal or hilar adenopathy. No esophageal wall thickening. No visualized thyroid nodule Lungs/Pleura: Chronic biapical pleuroparenchymal scarring and upper lobe bronchiectasis. No acute airspace disease. No features of pulmonary edema. No pleural fluid. Upper Abdomen: Calcified granuloma in the spleen. Musculoskeletal: There are no acute or suspicious osseous abnormalities. Review of the MIP images confirms the above findings. IMPRESSION:  1. No pulmonary embolus. 2. Chronic biapical pleuroparenchymal scarring and upper lobe bronchiectasis. 3. Minimal contrast refluxing into the hepatic veins, can be seen with elevated right heart pressures. Aortic Atherosclerosis (ICD10-I70.0). Electronically Signed   By: Keith Rake M.D.   On: 05/20/2022 19:40   DG Chest 2 View  Result Date: 05/20/2022 CLINICAL DATA:  Chest pain EXAM: CHEST - 2 VIEW COMPARISON:  01/09/2019 FINDINGS: The heart size and mediastinal contours are within normal limits. Aortic atherosclerosis. Chronic biapical pleuroparenchymal scarring, right greater than left. No focal airspace consolidation, pleural effusion, or pneumothorax. The visualized  skeletal structures are unremarkable. IMPRESSION: No active cardiopulmonary disease. Electronically Signed   By: Davina Poke D.O.   On: 05/20/2022 14:08    Microbiology: No results found for this or any previous visit.  Labs: CBC: Recent Labs  Lab 05/20/22 1337  WBC 7.8  HGB 13.5  HCT 42.2  MCV 90.6  PLT 045   Basic Metabolic Panel: Recent Labs  Lab 05/20/22 1337  NA 135  K 3.8  CL 101  CO2 27  GLUCOSE 135*  BUN 23  CREATININE 0.92  CALCIUM 10.0   Liver Function Tests: No results for input(s): "AST", "ALT", "ALKPHOS", "BILITOT", "PROT", "ALBUMIN" in the last 168 hours. CBG: No results for input(s): "GLUCAP" in the last 168 hours.  Discharge time spent: less than 30 minutes.  Signed: Sharen Hones, MD [facility] 05/21/2022

## 2022-05-21 NOTE — Progress Notes (Signed)
Troponin of 42 called on this patient. MD on call paged to notify.

## 2022-05-21 NOTE — Plan of Care (Signed)
  Problem: Cardiac: Goal: Ability to achieve and maintain adequate cardiovascular perfusion will improve Outcome: Adequate for Discharge   Problem: Health Behavior/Discharge Planning: Goal: Ability to safely manage health-related needs after discharge will improve Outcome: Adequate for Discharge   Problem: Activity: Goal: Ability to tolerate increased activity will improve Outcome: Adequate for Discharge   Problem: Education: Goal: Understanding of cardiac disease, CV risk reduction, and recovery process will improve Outcome: Adequate for Discharge Goal: Individualized Educational Video(s) Outcome: Adequate for Discharge

## 2022-05-21 NOTE — TOC CM/SW Note (Signed)
Patient has orders to discharge home today. Chart reviewed. PCP is Maryland Pink, MD. On room air. No wounds. No TOC needs identified. CSW signing off.  Dayton Scrape, [place]

## 2022-05-24 ENCOUNTER — Other Ambulatory Visit: Payer: Self-pay | Admitting: Internal Medicine

## 2022-05-24 DIAGNOSIS — I208 Other forms of angina pectoris: Secondary | ICD-10-CM

## 2022-05-24 DIAGNOSIS — I25119 Atherosclerotic heart disease of native coronary artery with unspecified angina pectoris: Secondary | ICD-10-CM

## 2022-05-24 DIAGNOSIS — R079 Chest pain, unspecified: Secondary | ICD-10-CM

## 2022-05-25 ENCOUNTER — Other Ambulatory Visit: Payer: PRIVATE HEALTH INSURANCE

## 2022-05-29 ENCOUNTER — Telehealth (HOSPITAL_COMMUNITY): Payer: Self-pay | Admitting: Emergency Medicine

## 2022-05-29 DIAGNOSIS — I200 Unstable angina: Secondary | ICD-10-CM

## 2022-05-29 MED ORDER — METOPROLOL TARTRATE 100 MG PO TABS: 100.0000 mg | ORAL_TABLET | Freq: Once | ORAL | 0 refills | Status: DC

## 2022-05-29 NOTE — Telephone Encounter (Signed)
Reaching out to patient to offer assistance regarding upcoming cardiac imaging study; pt verbalizes understanding of appt date/time, parking situation and where to check in, pre-test NPO status and medications ordered, and verified current allergies; name and call back number provided for further questions should they arise Bonnie Bond RN Navigator Cardiac Imaging [name] [phone] office [phone] cell  Denies contrast allergy, PE study completed 7/16 and she states she didn't have any issues R ARM ONLY '100mg'$  metoprolol tartrate  '120mg'$  diltiazem Arrival 830

## 2022-05-31 ENCOUNTER — Ambulatory Visit
Admission: RE | Admit: 2022-05-31 | Discharge: 2022-05-31 | Disposition: A | Discharge status: Home / Self Care | Payer: Medicare HMO | Source: Ambulatory Visit | Attending: Internal Medicine | Admitting: Internal Medicine

## 2022-05-31 DIAGNOSIS — R079 Chest pain, unspecified: Secondary | ICD-10-CM | POA: Diagnosis not present

## 2022-05-31 DIAGNOSIS — I25119 Atherosclerotic heart disease of native coronary artery with unspecified angina pectoris: Secondary | ICD-10-CM | POA: Diagnosis present

## 2022-05-31 DIAGNOSIS — I208 Other forms of angina pectoris: Secondary | ICD-10-CM | POA: Insufficient documentation

## 2022-05-31 MED ORDER — METOPROLOL TARTRATE 5 MG/5ML IV SOLN
10.0000 mg | Freq: Once | INTRAVENOUS | Status: AC
  Administered 2022-05-31: 10 mg via INTRAVENOUS

## 2022-05-31 MED ORDER — NITROGLYCERIN 0.4 MG SL SUBL
0.8000 mg | SUBLINGUAL_TABLET | Freq: Once | SUBLINGUAL | Status: AC
  Administered 2022-05-31: 0.8 mg via SUBLINGUAL

## 2022-05-31 MED ORDER — IOHEXOL 350 MG/ML SOLN
75.0000 mL | Freq: Once | INTRAVENOUS | Status: AC | PRN
  Administered 2022-05-31: 75 mL via INTRAVENOUS

## 2022-05-31 NOTE — Progress Notes (Signed)
Patient tolerated CT well. Drank water after. Vital signs stable encourage to drink water throughout day.Reasons explained and verbalized understanding. Patient's blood pressure and heart rate elevated upon arrival. Patient took Metoprolol Tartrate '100mg'$  PO only today for cardiac CT. Spoke with Dr Garen Lah able to proceed with cardiac CT and to schedule an appointment with Dr Clayborn Bigness for blood pressure management. Patient's son and patient informed and spoke with Jone Baseman scheduler at [facility] office appointment set for August 8th at 10:00am.

## 2024-01-20 ENCOUNTER — Other Ambulatory Visit: Payer: Self-pay | Admitting: Gastroenterology

## 2024-01-20 DIAGNOSIS — R143 Flatulence: Secondary | ICD-10-CM

## 2024-01-20 DIAGNOSIS — R1013 Epigastric pain: Secondary | ICD-10-CM

## 2024-01-20 DIAGNOSIS — R1084 Generalized abdominal pain: Secondary | ICD-10-CM

## 2024-01-20 DIAGNOSIS — R140 Abdominal distension (gaseous): Secondary | ICD-10-CM

## 2024-01-27 ENCOUNTER — Ambulatory Visit
Admission: RE | Admit: 2024-01-27 | Discharge: 2024-01-27 | Disposition: A | Discharge status: Home / Self Care | Source: Ambulatory Visit | Attending: Gastroenterology | Admitting: Gastroenterology

## 2024-01-27 DIAGNOSIS — R140 Abdominal distension (gaseous): Secondary | ICD-10-CM | POA: Diagnosis present

## 2024-01-27 DIAGNOSIS — R1013 Epigastric pain: Secondary | ICD-10-CM | POA: Insufficient documentation

## 2024-01-27 DIAGNOSIS — R1084 Generalized abdominal pain: Secondary | ICD-10-CM | POA: Diagnosis present

## 2024-01-27 DIAGNOSIS — R143 Flatulence: Secondary | ICD-10-CM | POA: Insufficient documentation

## 2024-02-10 ENCOUNTER — Other Ambulatory Visit: Payer: Self-pay

## 2024-02-10 ENCOUNTER — Observation Stay
Admission: EM | Admit: 2024-02-10 | Discharge: 2024-02-11 | Disposition: A | Discharge status: Hospice | Attending: Emergency Medicine | Admitting: Emergency Medicine

## 2024-02-10 DIAGNOSIS — Z515 Encounter for palliative care: Secondary | ICD-10-CM

## 2024-02-10 DIAGNOSIS — R55 Syncope and collapse: Secondary | ICD-10-CM | POA: Diagnosis not present

## 2024-02-10 DIAGNOSIS — R531 Weakness: Secondary | ICD-10-CM | POA: Diagnosis not present

## 2024-02-10 DIAGNOSIS — R102 Pelvic and perineal pain unspecified side: Secondary | ICD-10-CM | POA: Insufficient documentation

## 2024-02-10 DIAGNOSIS — M6281 Muscle weakness (generalized): Secondary | ICD-10-CM | POA: Diagnosis not present

## 2024-02-10 DIAGNOSIS — Z79899 Other long term (current) drug therapy: Secondary | ICD-10-CM | POA: Insufficient documentation

## 2024-02-10 DIAGNOSIS — I2510 Atherosclerotic heart disease of native coronary artery without angina pectoris: Secondary | ICD-10-CM | POA: Insufficient documentation

## 2024-02-10 DIAGNOSIS — R079 Chest pain, unspecified: Principal | ICD-10-CM | POA: Diagnosis present

## 2024-02-10 DIAGNOSIS — E039 Hypothyroidism, unspecified: Secondary | ICD-10-CM | POA: Diagnosis not present

## 2024-02-10 DIAGNOSIS — E871 Hypo-osmolality and hyponatremia: Secondary | ICD-10-CM

## 2024-02-10 DIAGNOSIS — N179 Acute kidney failure, unspecified: Secondary | ICD-10-CM

## 2024-02-10 DIAGNOSIS — Z7982 Long term (current) use of aspirin: Secondary | ICD-10-CM | POA: Diagnosis not present

## 2024-02-10 DIAGNOSIS — I10 Essential (primary) hypertension: Secondary | ICD-10-CM | POA: Diagnosis not present

## 2024-02-10 DIAGNOSIS — Z853 Personal history of malignant neoplasm of breast: Secondary | ICD-10-CM | POA: Insufficient documentation

## 2024-02-10 DIAGNOSIS — K638219 Small intestinal bacterial overgrowth, unspecified: Secondary | ICD-10-CM

## 2024-02-10 DIAGNOSIS — R0789 Other chest pain: Secondary | ICD-10-CM | POA: Diagnosis not present

## 2024-02-10 DIAGNOSIS — Z7189 Other specified counseling: Secondary | ICD-10-CM | POA: Insufficient documentation

## 2024-02-10 DIAGNOSIS — E890 Postprocedural hypothyroidism: Secondary | ICD-10-CM

## 2024-02-10 LAB — URINALYSIS, ROUTINE W REFLEX MICROSCOPIC
Bacteria, UA: NONE SEEN
Bilirubin Urine: NEGATIVE
Glucose, UA: NEGATIVE mg/dL
Hgb urine dipstick: NEGATIVE
Ketones, ur: 5 mg/dL — AB
Leukocytes,Ua: NEGATIVE
Nitrite: NEGATIVE
Protein, ur: 30 mg/dL — AB
Specific Gravity, Urine: 1.02 (ref 1.005–1.030)
pH: 5 (ref 5.0–8.0)

## 2024-02-10 LAB — COMPREHENSIVE METABOLIC PANEL WITH GFR
ALT: 22 U/L (ref 0–44)
AST: 28 U/L (ref 15–41)
Albumin: 4.7 g/dL (ref 3.5–5.0)
Alkaline Phosphatase: 64 U/L (ref 38–126)
Anion gap: 8 (ref 5–15)
BUN: 24 mg/dL — ABNORMAL HIGH (ref 8–23)
CO2: 24 mmol/L (ref 22–32)
Calcium: 10.3 mg/dL (ref 8.9–10.3)
Chloride: 96 mmol/L — ABNORMAL LOW (ref 98–111)
Creatinine, Ser: 1.16 mg/dL — ABNORMAL HIGH (ref 0.44–1.00)
GFR, Estimated: 44 mL/min — ABNORMAL LOW (ref >60)
Glucose, Bld: 101 mg/dL — ABNORMAL HIGH (ref 70–99)
Potassium: 4.8 mmol/L (ref 3.5–5.1)
Sodium: 128 mmol/L — ABNORMAL LOW (ref 135–145)
Total Bilirubin: 0.7 mg/dL (ref 0.0–1.2)
Total Protein: 8 g/dL (ref 6.5–8.1)

## 2024-02-10 LAB — CBC
HCT: 42.4 % (ref 36.0–46.0)
Hemoglobin: 14.2 g/dL (ref 12.0–15.0)
MCH: 29.8 pg (ref 26.0–34.0)
MCHC: 33.5 g/dL (ref 30.0–36.0)
MCV: 88.9 fL (ref 80.0–100.0)
Platelets: 224 10*3/uL (ref 150–400)
RBC: 4.77 MIL/uL (ref 3.87–5.11)
RDW: 12.4 % (ref 11.5–15.5)
WBC: 6.1 10*3/uL (ref 4.0–10.5)
nRBC: 0 % (ref 0.0–0.2)

## 2024-02-10 LAB — IRON AND TIBC
Iron: 73 ug/dL (ref 28–170)
Saturation Ratios: 17 % (ref 10.4–31.8)
TIBC: 419 ug/dL (ref 250–450)
UIBC: 346 ug/dL

## 2024-02-10 LAB — LIPASE, BLOOD: Lipase: 51 U/L (ref 11–51)

## 2024-02-10 LAB — VITAMIN D 25 HYDROXY (VIT D DEFICIENCY, FRACTURES): Vit D, 25-Hydroxy: 67.53 ng/mL (ref 30–100)

## 2024-02-10 LAB — FOLATE: Folate: >40 ng/mL (ref >5.9)

## 2024-02-10 LAB — VITAMIN B12: Vitamin B-12: 1101 pg/mL — ABNORMAL HIGH (ref 180–914)

## 2024-02-10 LAB — TROPONIN I (HIGH SENSITIVITY): Troponin I (High Sensitivity): 5 ng/L (ref <18)

## 2024-02-10 LAB — TSH: TSH: 1.058 u[IU]/mL (ref 0.350–4.500)

## 2024-02-10 MED ORDER — ACETAMINOPHEN 650 MG RE SUPP: 650.0000 mg | Freq: Four times a day (QID) | RECTAL | Status: DC | PRN

## 2024-02-10 MED ORDER — VITAMIN C 500 MG PO TABS
500.0000 mg | ORAL_TABLET | Freq: Every day | ORAL | Status: DC
  Administered 2024-02-11: 500 mg via ORAL
  Filled 2024-02-10: qty 1

## 2024-02-10 MED ORDER — VITAMIN D 25 MCG (1000 UNIT) PO TABS
1000.0000 [IU] | ORAL_TABLET | Freq: Every day | ORAL | Status: DC
  Administered 2024-02-11: 1000 [IU] via ORAL
  Filled 2024-02-10: qty 1

## 2024-02-10 MED ORDER — POLYETHYLENE GLYCOL 3350 17 G PO PACK: 17.0000 g | PACK | Freq: Every day | ORAL | Status: DC | PRN

## 2024-02-10 MED ORDER — LEVOTHYROXINE SODIUM 50 MCG PO TABS
75.0000 ug | ORAL_TABLET | Freq: Every day | ORAL | Status: DC
  Administered 2024-02-11: 75 ug via ORAL
  Filled 2024-02-10: qty 2

## 2024-02-10 MED ORDER — ONDANSETRON HCL 4 MG PO TABS: 4.0000 mg | ORAL_TABLET | Freq: Four times a day (QID) | ORAL | Status: DC | PRN

## 2024-02-10 MED ORDER — SODIUM CHLORIDE 0.9% FLUSH
3.0000 mL | Freq: Two times a day (BID) | INTRAVENOUS | Status: DC
Start: 2024-02-10 — End: 2024-02-11
  Administered 2024-02-11: 3 mL via INTRAVENOUS

## 2024-02-10 MED ORDER — SERTRALINE HCL 50 MG PO TABS
75.0000 mg | ORAL_TABLET | Freq: Every day | ORAL | Status: DC
Start: 2024-02-10 — End: 2024-02-11
  Administered 2024-02-10: 75 mg via ORAL
  Filled 2024-02-10: qty 2

## 2024-02-10 MED ORDER — SODIUM CHLORIDE 0.9 % IV BOLUS
500.0000 mL | Freq: Once | INTRAVENOUS | Status: AC
  Administered 2024-02-10: 500 mL via INTRAVENOUS

## 2024-02-10 MED ORDER — RIFAXIMIN 200 MG PO TABS
200.0000 mg | ORAL_TABLET | Freq: Two times a day (BID) | ORAL | Status: DC
Start: 2024-02-10 — End: 2024-02-10
  Filled 2024-02-10: qty 1

## 2024-02-10 MED ORDER — FAMOTIDINE 20 MG PO TABS
20.0000 mg | ORAL_TABLET | Freq: Two times a day (BID) | ORAL | Status: DC
  Administered 2024-02-10 – 2024-02-11 (×2): 20 mg via ORAL
  Filled 2024-02-10 (×2): qty 1

## 2024-02-10 MED ORDER — RIFAXIMIN 550 MG PO TABS
550.0000 mg | ORAL_TABLET | Freq: Three times a day (TID) | ORAL | Status: DC
  Administered 2024-02-10 – 2024-02-11 (×3): 550 mg via ORAL
  Filled 2024-02-10 (×6): qty 1

## 2024-02-10 MED ORDER — ISOSORBIDE MONONITRATE ER 60 MG PO TB24
30.0000 mg | ORAL_TABLET | Freq: Every day | ORAL | Status: DC
Start: 2024-02-11 — End: 2024-02-11
  Administered 2024-02-11: 30 mg via ORAL
  Filled 2024-02-10: qty 1

## 2024-02-10 MED ORDER — SIMETHICONE 80 MG PO CHEW: 80.0000 mg | CHEWABLE_TABLET | Freq: Four times a day (QID) | ORAL | Status: DC | PRN

## 2024-02-10 MED ORDER — ACETAMINOPHEN 325 MG PO TABS: 650.0000 mg | ORAL_TABLET | Freq: Four times a day (QID) | ORAL | Status: DC | PRN

## 2024-02-10 MED ORDER — LORATADINE 10 MG PO TABS
10.0000 mg | ORAL_TABLET | Freq: Every day | ORAL | Status: DC
  Administered 2024-02-10 – 2024-02-11 (×2): 10 mg via ORAL
  Filled 2024-02-10 (×2): qty 1

## 2024-02-10 MED ORDER — ONDANSETRON HCL 4 MG/2ML IJ SOLN: 4.0000 mg | Freq: Four times a day (QID) | INTRAMUSCULAR | Status: DC | PRN

## 2024-02-10 MED ORDER — ENOXAPARIN SODIUM 30 MG/0.3ML IJ SOSY
30.0000 mg | PREFILLED_SYRINGE | INTRAMUSCULAR | Status: DC
  Administered 2024-02-10: 30 mg via SUBCUTANEOUS
  Filled 2024-02-10: qty 0.3

## 2024-02-10 MED ORDER — LACTATED RINGERS IV SOLN: INTRAVENOUS | Status: AC

## 2024-02-10 MED ORDER — ASPIRIN 81 MG PO TBEC
81.0000 mg | DELAYED_RELEASE_TABLET | Freq: Every day | ORAL | Status: DC
  Administered 2024-02-11: 81 mg via ORAL
  Filled 2024-02-10: qty 1

## 2024-02-10 MED ORDER — PANTOPRAZOLE SODIUM 40 MG PO TBEC
40.0000 mg | DELAYED_RELEASE_TABLET | Freq: Every day | ORAL | Status: DC
  Administered 2024-02-11: 40 mg via ORAL
  Filled 2024-02-10: qty 1

## 2024-02-10 NOTE — ED Provider Triage Note (Signed)
 Emergency Medicine Provider Triage Evaluation Note  Bonnie Barker, a 89 y.o. female  was evaluated in triage.  Pt complains of chronic abdominal pain and bloating. She is under the care of [facility] and was advised to report to the ED for evaluation of gas, bloating, and small intestinal bacterial overgrowth. She denies FCS, NV, or melanotic stools  Review of Systems  Positive: Abd pain, bloating Negative: FCS  Physical Exam  BP (!) 167/115   Pulse 98   Temp 98.2 F (36.8 C) (Oral)   Resp 18   Ht 5\' 3"  (1.6 m)   Wt 45.4 kg   SpO2 100%   BMI 17.71 kg/m  Gen:   Awake, no distress  NAD Resp:  Normal effort CTA MSK:   Moves extremities without difficulty  ABD:  Soft, nontender  Medical Decision Making  Medically screening exam initiated at 1:04 PM.  Appropriate orders placed.  Bonnie Barker was informed that the remainder of the evaluation will be completed by another provider, this initial triage assessment does not replace that evaluation, and the importance of remaining in the ED until their evaluation is complete.  Geriatric patient to the ED from [facility] GI medicine for evaluation of chronic abdominal pain.    Lissa Hoard, PA-C 02/10/24 1344

## 2024-02-10 NOTE — Assessment & Plan Note (Signed)
 Patient endorses dizziness with generalized weakness with concern for syncope, suspected to be due to unintentional weight loss, poor oral intake and dehydration.  - IV fluids as ordered - PT/OT

## 2024-02-10 NOTE — ED Notes (Signed)
 Patient states that gas pains come and go, but has no pain to report at this time.

## 2024-02-10 NOTE — ED Notes (Signed)
 Patient taken to bathroom via wheelchair. Pt with two person assist to the toilet. Had placed in toilet to catch sample. Pt instructed to pull call cord when done. Pt voiced understanding.

## 2024-02-10 NOTE — Assessment & Plan Note (Signed)
 Suspected to be multifactorial in the setting of inadequate oral intake and medication induced given recent prescription for Bactrim.  - IV fluids as ordered - Hold home nephrotoxic agents - Recheck BMP in the a.m.

## 2024-02-10 NOTE — Assessment & Plan Note (Signed)
 Mild hyponatremia in the setting of AKI.  - IV fluids as ordered - Repeat BMP in the a.m.

## 2024-02-10 NOTE — Assessment & Plan Note (Addendum)
 Recently diagnosed with SIBO by GI and placed on Bactrim due to cost of rifaximin.  She endorses no relief in her symptoms.  Due to this, we will switch to rifaximin at this time.   - Start rifaximin twice daily - Continue simethicone as needed - Consider GI consultation

## 2024-02-10 NOTE — Assessment & Plan Note (Signed)
 Check TSH -Continue home Synthroid

## 2024-02-10 NOTE — ED Triage Notes (Signed)
 Patient states abdominal pain for several months; follows GI but was told to come to the ED today. Reports her pain is intermittent and currently 4/10.

## 2024-02-10 NOTE — H&P (Addendum)
 History and Physical    Patient: Bonnie Barker:[id number] DOB: [birth year] DOA: 02/10/2024 DOS: the patient was seen and examined on 02/10/2024 PCP: Jerl Mina, MD  Patient coming from: Home  Chief Complaint:  Chief Complaint  Patient presents with   Abdominal Pain   HPI: Bonnie Barker is a 89 y.o. female with medical history significant of non-obstructive CAD, SIBO on Bactrim, HTN, HLD, hypothyroidism, who presents to the ED due to abdominal pain.   Bonnie Barker states that for a long time, she has been experiencing "gas pains" that radiate up towards her chest and back of the neck. She has seen her PCP for this and has been referred to GI and ENT. Over the past 1 month, her symptoms have been gradually worsening and she has developed diffuse generalized weakness.  Earlier today, when she was walking to her kitchen, she felt weak all over with dizziness and had to hold onto the wall to walk.  Due to this, she called her son to bring her to the hospital.  She endorses continued gas pains with radiation up to the chest that are unchanged compared to prior.  She notes that her GI diagnosed her with a bacterial infection and put her on a medication that has not seem to help much.  She denies any palpitations, shortness of breath, nausea, vomiting.  She endorses approximately 10 pound weight loss over the last month.  Her appetite has been poor but she has been trying to keep up with water intake.  Per chart review, patient's GI diagnosed patient with SIBO and prescribed rifaximin.  Given high medication cost, she was switched to Bactrim which was filled on March 27.  ED course: On arrival to the ED, patient was hypertensive at 167/115 with heart rate of 98.  She was saturating at 100% on room air.  She was afebrile at 98.2.  Initial workup notable for unremarkable CBC, CMP with sodium of 128, BUN 24, creatinine 0.16 with GFR 44.  Troponin negative at 5.  Urinalysis with ketonuria and  proteinuria.  Patient started on IV fluids and TRH contacted for admission.  Review of Systems: As mentioned in the history of present illness. All other systems reviewed and are negative.  Past Medical History:  Diagnosis Date   Anxiety    Breast cancer (HCC)    Bronchitis    Carotid stenosis    bilateral   Chronic cough    Depression    Diverticulitis    Diverticulosis    Environmental allergies    HOH (hard of hearing)    Hypercholesteremia    Hypertension    Hypothyroidism    Neuropathy    Osteoporosis    Palpitations    Sinus trouble    Tachycardia    Trigeminal neuralgia    Past Surgical History:  Procedure Laterality Date   ABDOMINAL HYSTERECTOMY     CATARACT EXTRACTION W/PHACO Right 05/03/2015   Procedure: CATARACT EXTRACTION PHACO AND INTRAOCULAR LENS PLACEMENT (IOC);  Surgeon: Galen Manila, MD;  Location: [facility] ORS;  Service: Ophthalmology;  Laterality: Right;  [place] 00:54 AP% 18.8 CDE 10.21 fluid pack lot # [id number]   CATARACT EXTRACTION W/PHACO Left 05/17/2015   Procedure: CATARACT EXTRACTION PHACO AND INTRAOCULAR LENS PLACEMENT (IOC);  Surgeon: Galen Manila, MD;  Location: [facility] ORS;  Service: Ophthalmology;  Laterality: Left;  [place] 01:00.5 AP% 17.8 CDE 10.76 fluid pack lot # [id number]   MASTECTOMY     THYROIDECTOMY, PARTIAL     Social  History:  reports that she has never smoked. She has never used smokeless tobacco. She reports that she does not drink alcohol. No history on file for drug use.  Allergies  Allergen Reactions   Acthib [Haemophilus B Polysacc Tetanus Toxoid Conj Vaccine]    Apap [Acetaminophen]    Banana Concentrate [Banana]    Benadryl [Diphenhydramine Hcl (Sleep)]    Bentyl [Dicyclomine Hcl]    Biaxin [Clarithromycin]    Codeine    Eryc [Erythromycin]    Etodolac    Flagyl [Metronidazole]    Fosamax [Alendronate Sodium]    Penicillins    Shellfish Allergy    Statins    Tetracyclines & Related    Tramadol    Vibramycin  [Doxycycline Calcium]     History reviewed. No pertinent family history.  Prior to Admission medications   Medication Sig Start Date End Date Taking? Authorizing Provider  ascorbic acid (VITAMIN C) 500 MG tablet Take 500 mg by mouth daily.    [provider]  aspirin 81 MG tablet Take 81 mg by mouth daily.    [provider]  cholecalciferol (VITAMIN D3) 25 MCG (1000 UNIT) tablet Take 1,000 Units by mouth daily.    [provider]  ezetimibe (ZETIA) 10 MG tablet Take 1 tablet (10 mg total) by mouth daily. 05/21/22 05/21/23  Marrion Coy, MD  irbesartan (AVAPRO) 75 MG tablet Take 0.5 tablets (37.5 mg total) by mouth daily. 05/22/22   Marrion Coy, MD  isosorbide mononitrate (IMDUR) 60 MG 24 hr tablet Take 1 tablet (60 mg total) by mouth daily. 05/22/22   Marrion Coy, MD  levothyroxine (SYNTHROID, LEVOTHROID) 75 MCG tablet Take 75 mcg by mouth daily before breakfast.    [provider]  loratadine (CLARITIN) 10 MG tablet Take 10 mg by mouth daily.    [provider]  metoprolol tartrate (LOPRESSOR) 100 MG tablet Take 1 tablet (100 mg total) by mouth once for 1 dose. Please take one time dose 100mg  metoprolol tartrate 2 hr prior to cardiac CT for HR control IF HR >55bpm. 05/29/22 05/29/22  Debbe Odea, MD  Multiple Vitamin (MULTIVITAMIN) tablet Take 1 tablet by mouth daily.    [provider]  nitroGLYCERIN (NITROSTAT) 0.3 MG SL tablet Place 1 tablet (0.3 mg total) under the tongue every 5 (five) minutes as needed for chest pain. 05/21/22 05/21/23  Marrion Coy, MD  omeprazole (PRILOSEC) 10 MG capsule Take 10 mg by mouth daily.    [provider]    Physical Exam: Vitals:   02/10/24 1217 02/10/24 1218 02/10/24 1220  BP:  (!) 167/115   Pulse:  98   Resp:  18   Temp:   98.2 F (36.8 C)  TempSrc:   Oral  SpO2:  100%   Weight: 45.4 kg    Height: 5\' 3"  (1.6 m)     Physical Exam Vitals and nursing note reviewed.   Constitutional:      Appearance: She is cachectic.  HENT:     Head: Normocephalic and atraumatic.     Mouth/Throat:     Mouth: Mucous membranes are moist.     Comments: Slightly dry oral pharynx Eyes:     Extraocular Movements: Extraocular movements intact.     Pupils: Pupils are equal, round, and reactive to light.  Cardiovascular:     Rate and Rhythm: Normal rate and regular rhythm.     Heart sounds: No murmur heard. Pulmonary:     Effort: Pulmonary effort is normal. No  respiratory distress.     Breath sounds: Normal breath sounds.  Abdominal:     General: Abdomen is flat. Bowel sounds are increased.     Palpations: Abdomen is soft.     Tenderness: There is no abdominal tenderness.  Musculoskeletal:     Right lower leg: No edema.     Left lower leg: No edema.  Skin:    General: Skin is warm and dry.  Neurological:     Mental Status: She is alert and oriented to person, place, and time.  Psychiatric:        Mood and Affect: Mood normal.        Behavior: Behavior normal.    Data Reviewed: CBC with WBC of 6.1, hemoglobin of 14.2, platelets of 224 CMP with sodium of 128, potassium 4.8, bicarb 24, BUN 24, creatinine 1.16, AST 28, ALT 22, GFR of 44 Lipase 51 Troponin 5 Urinalysis with ketonuria and proteinuria only  EKG personally reviewed.  Sinus rhythm with rate of 87.  No acute ST or T wave changes concerning for acute ischemia.  Results are pending, will review when available.  Assessment and Plan:  * Pre-syncope Patient endorses dizziness with generalized weakness with concern for syncope, suspected to be due to unintentional weight loss, poor oral intake and dehydration.  - IV fluids as ordered - PT/OT  AKI (acute kidney injury) (HCC) Suspected to be multifactorial in the setting of inadequate oral intake and medication induced given recent prescription for Bactrim.  - IV fluids as ordered - Hold home nephrotoxic agents - Recheck BMP in the  a.m.  Generalized weakness Multifactorial in the setting of weight loss, poor p.o. intake, and potential vitamin deficiencies in light of recent diagnosis of SIBO.  - PT/OT - Check vitamin B12, vitamin B1, iron panel, vitamin D, folic acid  Small intestinal bacterial overgrowth (SIBO) Recently diagnosed with SIBO by GI and placed on Bactrim due to cost of rifaximin.  She endorses no relief in her symptoms.  Due to this, we will switch to rifaximin at this time.   - Start rifaximin twice daily - Continue simethicone as needed - Consider GI consultation  Atypical chest pain Low suspicion that chest pain is secondary to cardiovascular etiology.  Previous admission for atypical chest pain in 2023 at which time CT coronary study demonstrated minimal nonobstructive CAD.  - Telemetry monitoring overnight - Repeat troponin  Essential hypertension - Continue home Imdur - Hold home irbesartan given AKI  Hypothyroidism - Check TSH - Continue home Synthroid  Hyponatremia Mild hyponatremia in the setting of AKI.  - IV fluids as ordered - Repeat BMP in the a.m.  Advance Care Planning:   Code Status: Full Code verified by patient  Consults: none  Family Communication: Patient's son updated at bedside  Severity of Illness: The appropriate patient status for this patient is OBSERVATION. Observation status is judged to be reasonable and necessary in order to provide the required intensity of service to ensure the patient's safety. The patient's presenting symptoms, physical exam findings, and initial radiographic and laboratory data in the context of their medical condition is felt to place them at decreased risk for further clinical deterioration. Furthermore, it is anticipated that the patient will be medically stable for discharge from the hospital within 2 midnights of admission.   Author: Verdene Lennert, MD 02/10/2024 4:26 PM  For on call review [email or web address].

## 2024-02-10 NOTE — ED Notes (Signed)
 Patient reports a pain sensation that comes and goes radiating from the left side of her neck up to the back of her head.

## 2024-02-10 NOTE — ED Provider Notes (Signed)
 [facility] Provider Note    Event Date/Time   First MD Initiated Contact with Patient 02/10/24 1303     (approximate)   History   Chest pain and weakness   HPI  STORI ROYSE is a 89 y.o. female with a history of anxiety, breast cancer, hypothyroidism who presents with complaints of near syncope and chest discomfort.  Patient reports she got out of bed today went to the kitchen and felt lightheaded and had to lean against the counter.  She reports in the emergency department she noticed mild chest discomfort "in a couple of places ".  No back pain.  No fevers chills cough or shortness of breath.  No pleurisy.  No palpitations.  She denies abdominal pain to me but reports she has been seeing GI for intermittent gas discomfort     Physical Exam   Triage Vital Signs: ED Triage Vitals  Encounter Vitals Group     BP 02/10/24 1218 (!) 167/115     Systolic BP Percentile --      Diastolic BP Percentile --      Pulse Rate 02/10/24 1218 98     Resp 02/10/24 1218 18     Temp 02/10/24 1220 98.2 F (36.8 C)     Temp Source 02/10/24 1220 Oral     SpO2 02/10/24 1218 100 %     Weight 02/10/24 1217 45.4 kg (100 lb)     Height 02/10/24 1217 1.6 m (5\' 3" )     Head Circumference --      Peak Flow --      Pain Score 02/10/24 1217 4     Pain Loc --      Pain Education --      Exclude from Growth Chart --     Most recent vital signs: Vitals:   02/10/24 1218 02/10/24 1220  BP: (!) 167/115   Pulse: 98   Resp: 18   Temp:  98.2 F (36.8 C)  SpO2: 100%      General: Awake, no distress.  CV:  Good peripheral perfusion.  Resp:  Normal effort.  Clear to auscultation bilaterally Abd:  No distention.  Soft, nontender Other:     ED Results / Procedures / Treatments   Labs (all labs ordered are listed, but only abnormal results are displayed) Labs Reviewed  COMPREHENSIVE METABOLIC PANEL WITH GFR - Abnormal; Notable for the following components:       Result Value   Sodium 128 (*)    Chloride 96 (*)    Glucose, Bld 101 (*)    BUN 24 (*)    Creatinine, Ser 1.16 (*)    GFR, Estimated 44 (*)    All other components within normal limits  URINALYSIS, ROUTINE W REFLEX MICROSCOPIC - Abnormal; Notable for the following components:   Color, Urine YELLOW (*)    APPearance HAZY (*)    Ketones, ur 5 (*)    Protein, ur 30 (*)    All other components within normal limits  LIPASE, BLOOD  CBC  TROPONIN I (HIGH SENSITIVITY)     EKG  ED ECG REPORT I, Jene Every, the attending physician, personally viewed and interpreted this ECG.  Date: 02/10/2024  Rhythm: normal sinus rhythm QRS Axis: normal Intervals: normal ST/T Wave abnormalities: normal Narrative Interpretation: no evidence of acute ischemia    RADIOLOGY     PROCEDURES:  Critical Care performed:   Procedures   MEDICATIONS ORDERED IN ED: Medications  sodium  chloride 0.9 % bolus 500 mL (0 mLs Intravenous Stopped 02/10/24 1441)     IMPRESSION / MDM / ASSESSMENT AND PLAN / ED COURSE  I reviewed the triage vital signs and the nursing notes. Patient's presentation is most consistent with acute presentation with potential threat to life or bodily function.  Patient presents with chest pain and weakness as detailed above, symptoms seem to have resolved, review of record demonstrates she does see Dr. Juliann Pares of cardiology  Differential includes ACS, NSTEMI, electrolyte abnormalities, arrhythmia  Overall well-appearing here in the emergency department, she does have mild hyponatremia which could be a cause of her symptoms.  Will add on troponin, EKG to evaluate for ACS  Enzymes are reassuring  Given hyponatremia, near syncopal episode I have consulted the hospitalist team for admission        FINAL CLINICAL IMPRESSION(S) / ED DIAGNOSES   Final diagnoses:  Chest pain, unspecified type  Near syncope     Rx / DC Orders   ED Discharge Orders     None         Note:  This document was prepared using Dragon voice recognition software and may include unintentional dictation errors.   Jene Every, MD 02/10/24 [phone]

## 2024-02-10 NOTE — Assessment & Plan Note (Signed)
 Multifactorial in the setting of weight loss, poor p.o. intake, and potential vitamin deficiencies in light of recent diagnosis of SIBO.  - PT/OT - Check vitamin B12, vitamin B1, iron panel, vitamin D, folic acid

## 2024-02-10 NOTE — ED Notes (Signed)
Patient placed on cardiac monitoring.

## 2024-02-10 NOTE — Assessment & Plan Note (Signed)
-   Continue home Imdur - Hold home irbesartan given AKI

## 2024-02-10 NOTE — Assessment & Plan Note (Signed)
 Low suspicion that chest pain is secondary to cardiovascular etiology.  Previous admission for atypical chest pain in 2023 at which time CT coronary study demonstrated minimal nonobstructive CAD.  - Telemetry monitoring overnight - Repeat troponin

## 2024-02-10 NOTE — ED Notes (Signed)
 Patient taken to the bathroom via wheelchair, and ambulatory to bathroom with 1 person assist. Pt given call light to call for assistance once done. Pt to be placed on cardiac monitor upon return.

## 2024-02-11 ENCOUNTER — Other Ambulatory Visit: Payer: Self-pay

## 2024-02-11 DIAGNOSIS — R079 Chest pain, unspecified: Secondary | ICD-10-CM

## 2024-02-11 DIAGNOSIS — Z515 Encounter for palliative care: Secondary | ICD-10-CM | POA: Diagnosis not present

## 2024-02-11 DIAGNOSIS — N179 Acute kidney failure, unspecified: Secondary | ICD-10-CM | POA: Diagnosis not present

## 2024-02-11 DIAGNOSIS — Z7189 Other specified counseling: Secondary | ICD-10-CM

## 2024-02-11 DIAGNOSIS — E871 Hypo-osmolality and hyponatremia: Secondary | ICD-10-CM | POA: Diagnosis not present

## 2024-02-11 DIAGNOSIS — R55 Syncope and collapse: Secondary | ICD-10-CM | POA: Diagnosis not present

## 2024-02-11 DIAGNOSIS — E890 Postprocedural hypothyroidism: Secondary | ICD-10-CM | POA: Diagnosis not present

## 2024-02-11 LAB — COMPREHENSIVE METABOLIC PANEL WITH GFR
ALT: 18 U/L (ref 0–44)
AST: 24 U/L (ref 15–41)
Albumin: 3.7 g/dL (ref 3.5–5.0)
Alkaline Phosphatase: 52 U/L (ref 38–126)
Anion gap: 6 (ref 5–15)
BUN: 17 mg/dL (ref 8–23)
CO2: 24 mmol/L (ref 22–32)
Calcium: 9.4 mg/dL (ref 8.9–10.3)
Chloride: 100 mmol/L (ref 98–111)
Creatinine, Ser: 0.81 mg/dL (ref 0.44–1.00)
GFR, Estimated: >60 mL/min (ref >60)
Glucose, Bld: 70 mg/dL (ref 70–99)
Potassium: 4.3 mmol/L (ref 3.5–5.1)
Sodium: 130 mmol/L — ABNORMAL LOW (ref 135–145)
Total Bilirubin: 0.6 mg/dL (ref 0.0–1.2)
Total Protein: 6.2 g/dL — ABNORMAL LOW (ref 6.5–8.1)

## 2024-02-11 LAB — CBC
HCT: 37.2 % (ref 36.0–46.0)
Hemoglobin: 12.7 g/dL (ref 12.0–15.0)
MCH: 30.2 pg (ref 26.0–34.0)
MCHC: 34.1 g/dL (ref 30.0–36.0)
MCV: 88.4 fL (ref 80.0–100.0)
Platelets: 186 10*3/uL (ref 150–400)
RBC: 4.21 MIL/uL (ref 3.87–5.11)
RDW: 12.4 % (ref 11.5–15.5)
WBC: 6.2 10*3/uL (ref 4.0–10.5)
nRBC: 0 % (ref 0.0–0.2)

## 2024-02-11 LAB — MAGNESIUM: Magnesium: 1.9 mg/dL (ref 1.7–2.4)

## 2024-02-11 MED ORDER — LORAZEPAM 0.5 MG PO TABS
0.5000 mg | ORAL_TABLET | Freq: Three times a day (TID) | ORAL | 0 refills | Status: AC | PRN
  Filled 2024-02-11: qty 9, 3d supply, fill #0

## 2024-02-11 MED ORDER — SIMETHICONE 80 MG PO CHEW
80.0000 mg | CHEWABLE_TABLET | Freq: Four times a day (QID) | ORAL | 0 refills | Status: AC | PRN
  Filled 2024-02-11: qty 30, 8d supply, fill #0

## 2024-02-11 NOTE — Evaluation (Signed)
 Physical Therapy Evaluation Patient Details Name: Bonnie Barker MRN: [id number] DOB: 1929/07/20 Today's Date: 02/11/2024  History of Present Illness  presented to ER secondary to abdminal pain; admaitted for management of pre-syncope, AKI  Clinical Impression  Patient resting in bed upon arrival to room; supportive son at bedside.  Patient alert and oriented, follows commands and agreeable to participation with session.  Denies pain; does endorse persistent and generalized weakness with mobility efforts.  Bilat UE/LE strength and ROM grossly symmetrical and WFL for basic transfers and gait; no focal weakness appreciated.  Able to complete bed mobility with mod indep; sit/stand, basic transfers and gait (30') with HHA, cga/min assist.  Demonstrates very short, shuffling steps, limited heel strike toe off; notable balance deficits, constantly reaching for walls/furniture for external stabilization.  Single standing rest break in distance due to fatigue Completes additional 30' with RW, cga/close sup-improved stepping pattern, standing balance and overall activity tolernace; min cuing for walker position, management, but responds well to cuing.  Do recommend continued use of RW for all transfers and mobility at this time; patient/son voiced understanding and agreement. Would benefit from skilled PT to address above deficits and promote optimal return to PLOF.; recommend post-acute PT follow up as indicated by interdisciplinary care team.          If plan is discharge home, recommend the following: A little help with walking and/or transfers;A little help with bathing/dressing/bathroom   Can travel by private vehicle        Equipment Recommendations  (has access to son's walker)  Recommendations for Other Services       Functional Status Assessment Patient has had a recent decline in their functional status and demonstrates the ability to make significant improvements in function in a  reasonable and predictable amount of time.     Precautions / Restrictions Precautions Precautions: Fall Restrictions Weight Bearing Restrictions Per Provider Order: No      Mobility  Bed Mobility Overal bed mobility: Modified Independent                  Transfers Overall transfer level: Needs assistance   Transfers: Sit to/from Stand             General transfer comment: cga/min assist for sit/stand without assist device; cga/close sup for sit/stand with RW.  Does require increased time to fully activate and extend bilat hips/knees with transition    Ambulation/Gait Ambulation/Gait assistance: Min assist Gait Distance (Feet): 30 Feet Assistive device: 1 person hand held assist         General Gait Details: very short, shuffling steps, limited heel strike toe off; notable balance deficits, constantly reaching for walls/furniture for external stabilization.  Single standing rest break in distance due to fatigue  Stairs            Wheelchair Mobility     Tilt Bed    Modified Rankin (Stroke Patients Only)       Balance Overall balance assessment: Needs assistance Sitting-balance support: No upper extremity supported, Feet supported Sitting balance-Leahy Scale: Good     Standing balance support: No upper extremity supported Standing balance-Leahy Scale: Poor                               Pertinent Vitals/Pain Pain Assessment Pain Assessment: No/denies pain    Home Living Family/patient expects to be discharged to:: Private residence Living Arrangements: Alone (son lives close and provides  daily support/assist as needed)   Type of Home: House Home Access: Stairs to enter   [employer] of Steps: 1   Home Layout: One level Home Equipment: None Additional Comments: has access to family walker if needed    Prior Function Prior Level of Function : Independent/Modified Independent             Mobility  Comments: Mod indep with ADLs (sponge bath) and household mobilization (furniture walking); denies fall history.  Does endorse notable decline in activity tolerance, often pacing/self-limiting activities due to fatigue levels.       Extremity/Trunk Assessment   Upper Extremity Assessment Upper Extremity Assessment: Generalized weakness    Lower Extremity Assessment Lower Extremity Assessment: Generalized weakness (grossly 4-/5 throughout; no focal weakness appreciated)       Communication        Cognition Arousal: Alert Behavior During Therapy: WFL for tasks assessed/performed   PT - Cognitive impairments: No apparent impairments                                 Cueing       General Comments      Exercises Other Exercises Other Exercises: 63' with RW, cga/close sup-improved stepping pattern, standing balance and overall activity tolernace; min cuing for walker position, management, but responds well to cuing.  Do recommend continued use of RW for all transfers and mobility at this time; patient/son voiced understanding and agreement. Other Exercises: Toilet transfer, ambulatory with RW, cga/close sup; sit/stand from standard toilet surface, cga/close sup with grab bar Other Exercises: Standing balance at sink for hand hygiene, close sup; functional reach approx 2-3" from immediate BOS, often using contralateral UE support for safety/stabilization   Assessment/Plan    PT Assessment Patient needs continued PT services  PT Problem List Decreased strength;Decreased balance;Decreased activity tolerance;Decreased knowledge of use of DME;Decreased safety awareness;Decreased knowledge of precautions;Cardiopulmonary status limiting activity;Decreased mobility       PT Treatment Interventions DME instruction;Gait training;Stair training;Functional mobility training;Therapeutic activities;Therapeutic exercise;Balance training;Patient/family education    PT Goals  (Current goals can be found in the Care Plan section)  Acute Rehab PT Goals Patient Stated Goal: to return home PT Goal Formulation: With patient/family Time For Goal Achievement: 02/25/24 Potential to Achieve Goals: Fair    Frequency Min 2X/week     Co-evaluation               AM-PAC PT "6 Clicks" Mobility  Outcome Measure Help needed turning from your back to your side while in a flat bed without using bedrails?: None Help needed moving from lying on your back to sitting on the side of a flat bed without using bedrails?: None Help needed moving to and from a bed to a chair (including a wheelchair)?: A Little Help needed standing up from a chair using your arms (e.g., wheelchair or bedside chair)?: A Little Help needed to walk in hospital room?: A Little Help needed climbing 3-5 steps with a railing? : A Little 6 Click Score: 20    End of Session Equipment Utilized During Treatment: Gait belt Activity Tolerance: Patient tolerated treatment well Patient left: in bed;with call bell/phone within reach;with bed alarm set;with family/visitor present Nurse Communication: Mobility status PT Visit Diagnosis: Muscle weakness (generalized) (M62.81);Difficulty in walking, not elsewhere classified (R26.2)    Time: 9604-5409 PT Time Calculation (min) (ACUTE ONLY): 38 min   Charges:   PT Evaluation $PT Eval  Moderate Complexity: 1 Mod PT Treatments $Therapeutic Activity: 8-22 mins PT General Charges $$ ACUTE PT VISIT: 1 Visit        Cale Decarolis H. Manson Passey, PT, DPT, NCS 02/11/24, 2:00 PM [phone]

## 2024-02-11 NOTE — Discharge Summary (Signed)
 Physician Discharge Summary   Patient: Bonnie Barker MRN: [id number] DOB: 1929/01/16  Admit date:     02/10/2024  Discharge date: 02/11/24  Discharge Physician: Delfino Lovett   PCP: Jerl Mina, MD   Recommendations at discharge:   Home with hospice  Discharge Diagnoses: Principal Problem:   Near syncope Active Problems:   AKI (acute kidney injury) (HCC)   Generalized weakness   Small intestinal bacterial overgrowth (SIBO)   Chest pain   Essential hypertension   Hypothyroidism   Hyponatremia   Goals of care, counseling/discussion   Hospice care patient  Hospital Course: Assessment and Plan:  89 y.o. female with medical history significant of non-obstructive CAD, SIBO on Bactrim, HTN, HLD, hypothyroidism, who presents to the ED due to abdominal pain   Pre-syncope AKI (acute kidney injury) (HCC) Suspected to be multifactorial in the setting of inadequate oral intake and medication induced given recent prescription for Bactrim.   Generalized weakness Multifactorial in the setting of weight loss, poor p.o. intake, and potential vitamin deficiencies in light of recent diagnosis of SIBO.   Small intestinal bacterial overgrowth (SIBO) Recently diagnosed with SIBO by GI and placed on Bactrim due to cost of rifaximin.  She endorses no relief in her symptoms.      Atypical chest pain Low suspicion that chest pain is secondary to cardiovascular etiology.  Previous admission for atypical chest pain in 2023 at which time CT coronary study demonstrated minimal nonobstructive CAD.   Essential hypertension Hypothyroidism Hyponatremia         Disposition: Hospice care Diet recommendation:  Discharge Diet Orders (From admission, onward)     Start     Ordered   02/11/24 0000  Diet - low sodium heart healthy        02/11/24 1127           Carb modified diet DISCHARGE MEDICATION: Allergies as of 02/11/2024       Reactions   Acthib [haemophilus B Polysacc Tetanus  Toxoid Conj Vaccine]    Apap [acetaminophen]    Banana Concentrate [banana]    Benadryl [diphenhydramine Hcl (sleep)]    Bentyl [dicyclomine Hcl]    Biaxin [clarithromycin]    Codeine    Eryc [erythromycin]    Etodolac    Flagyl [metronidazole]    Fosamax [alendronate Sodium]    Penicillins    Shellfish Allergy    Statins    Tetracyclines & Related    Tramadol    Vibramycin [doxycycline Calcium]         Medication List     STOP taking these medications    famotidine 20 MG tablet Commonly known as: PEPCID   nitroGLYCERIN 0.3 MG SL tablet Commonly known as: Nitrostat       TAKE these medications    ascorbic acid 500 MG tablet Commonly known as: VITAMIN C Take 500 mg by mouth daily.   aspirin 81 MG tablet Take 81 mg by mouth daily.   cholecalciferol 25 MCG (1000 UNIT) tablet Commonly known as: VITAMIN D3 Take 1,000 Units by mouth daily.   irbesartan 75 MG tablet Commonly known as: AVAPRO Take 0.5 tablets (37.5 mg total) by mouth daily. What changed: how much to take   isosorbide mononitrate 30 MG 24 hr tablet Commonly known as: IMDUR Take 30 mg by mouth daily.   levothyroxine 75 MCG tablet Commonly known as: SYNTHROID Take 75 mcg by mouth daily before breakfast.   loratadine 10 MG tablet Commonly known as: CLARITIN Take 10 mg  by mouth daily.   LORazepam 0.5 MG tablet Commonly known as: Ativan Take 1 tablet (0.5 mg total) by mouth every 8 (eight) hours as needed for up to 3 days for anxiety.   metoprolol tartrate 100 MG tablet Commonly known as: LOPRESSOR Take 1 tablet (100 mg total) by mouth once for 1 dose. Please take one time dose 100mg  metoprolol tartrate 2 hr prior to cardiac CT for HR control IF HR >55bpm.   multivitamin tablet Take 1 tablet by mouth daily.   pantoprazole 40 MG tablet Commonly known as: PROTONIX Take 40 mg by mouth daily.   sertraline 50 MG tablet Commonly known as: ZOLOFT Take 75 mg by mouth at bedtime.    simethicone 80 MG chewable tablet Commonly known as: MYLICON Chew 1 tablet (80 mg total) by mouth every 6 (six) hours as needed for flatulence (gas pain).   sulfamethoxazole-trimethoprim 800-160 MG tablet Commonly known as: BACTRIM DS Take 1 tablet by mouth 2 (two) times daily.        Follow-up Information     Jerl Mina, MD. Go on 02/17/2024.   Specialty: Family Medicine Why: [facility] Discharge F/UP. You already have a appointment on 02/17/24 @ 3:30pm. The office said this appointment  can be used for your  hospital follow up. Contact information: [address] [place] [address] [phone]                Discharge Exam: Ceasar Mons Weights   02/10/24 1217  Weight: 35.19 kg   89 year old cachectic looking female lying in the bed comfortably without any acute distress Lungs clear to auscultation bilaterally Heart regular rate and rhythm Abdomen soft, benign Neuro alert and awake, nonfocal Psych normal mood and affect  Condition at discharge: fair  The results of significant diagnostics from this hospitalization (including imaging, microbiology, ancillary and laboratory) are listed below for reference.   Imaging Studies: DG UGI W SMALL BOWEL Result Date: 01/27/2024 CLINICAL DATA:  89 year old female with generalized abdominal pain, bloating, flatulence, and dyspepsia EXAM: UPPER GI SERIES WITH SMALL BOWEL FOLLOW-THROUGH FLUOROSCOPY: Radiation Exposure Index (as provided by the fluoroscopic device): 32.9 mGy Kerma TECHNIQUE: Single contrast upper GI series using thin barium. Subsequently, serial images of the small bowel were obtained including spot views of the terminal ileum. This exam was performed by Buzzy Han, PA-C, and was supervised and interpreted by Dr. Gilmer Mor, MD. COMPARISON:  None Available. FINDINGS: Age-related esophageal dysmotility, with slow esophageal transit, retropulsion, reflux, noted worsened with laying flat.  Small Zenker's diverticulum. Rapid transit of the barium bolus through the stomach. Barium tablet transiently suspended in the mid esophagus. Unremarkable small bowel. Right colon was opacified at 1 hour and 30 minutes. IMPRESSION: Esophageal dysmotility. Small Zenker's diverticulum. Reflux identified. Unremarkable small bowel Electronically Signed   By: Gilmer Mor D.O.   On: 01/27/2024 13:37    Microbiology: No results found for this or any previous visit.  Labs: CBC: Recent Labs  Lab 02/10/24 1220 02/11/24 0559  WBC 6.1 6.2  HGB 14.2 12.7  HCT 42.4 37.2  MCV 88.9 88.4  PLT 224 186   Basic Metabolic Panel: Recent Labs  Lab 02/10/24 1220 02/11/24 0559  NA 128* 130*  K 4.8 4.3  CL 96* 100  CO2 24 24  GLUCOSE 101* 70  BUN 24* 17  CREATININE 1.16* 0.81  CALCIUM 10.3 9.4  MG  --  1.9   Liver Function Tests: Recent Labs  Lab 02/10/24 1220 02/11/24  0559  AST 28 24  ALT 22 18  ALKPHOS 64 52  BILITOT 0.7 0.6  PROT 8.0 6.2*  ALBUMIN 4.7 3.7   CBG: No results for input(s): "GLUCAP" in the last 168 hours.  Discharge time spent: greater than 30 minutes.  Signed: Delfino Lovett, MD [facility] 02/11/2024

## 2024-02-11 NOTE — Progress Notes (Addendum)
 [facility] Liaison Note  Received request from Dennison Mascot, RN, Transitions of Care Manager, for hospice services at home after discharge.  Spoke with patient and son to initiate education related to hospice philosophy, services, and team approach to care.  Patient and son verbalized understanding of information given.  Per discussion, the plan is for discharge home today.    DME needs discussed.  Patient has the following equipment in the home:  none  Patient/family requests the following equipment in the home: none  The address has been verified and is correct in the chart.  Please send signed and completed DNR home with the patient/family.  Please provide prescriptions at discharge as needed to ensure ongoing symptom management.    [facility] information and contact numbers given to patient and son.  Above information shared with Lesli Albee, Transitions of Care Manager.     Please call with any Hospice related questions or concerns.  Thank you for the opportunity to participate in this patient's care.  Redge Gainer, [facility] Liaison [phone]

## 2024-02-11 NOTE — TOC Initial Note (Signed)
 Transition of Care [facility]) - Initial/Assessment Note    Patient Details  Name: Bonnie Barker MRN: [id number] Date of Birth: March 03, 1929  [facility] CM/SW Contact:    Chapman Fitch, RN Phone Number: 02/11/2024, 11:30 AM  Clinical Narrative:                    Met with patient and son Harrison Mons at bedside.  Patient to discharge today.  MD recommends home with hospice Patient and son confirm they do not have a preference of hospice agency.  Referral made to Ree Kida with [employer], contracting No Dme needs Son to transport     Patient Goals and CMS Choice            Expected Discharge Plan and Services         Expected Discharge Date: 02/11/24                                    Prior Living Arrangements/Services                       Activities of Daily Living      Permission Sought/Granted                  Emotional Assessment              Admission diagnosis:  Pre-syncope [R55] Near syncope [R55] Chest pain, unspecified type [R07.9] Patient Active Problem List   Diagnosis Date Noted   Goals of care, counseling/discussion 02/11/2024   Hospice care patient 02/11/2024   Near syncope 02/10/2024   AKI (acute kidney injury) (HCC) 02/10/2024   Generalized weakness 02/10/2024   Small intestinal bacterial overgrowth (SIBO) 02/10/2024   Pelvic pressure in female 02/10/2024   Hyponatremia 02/10/2024   Unstable angina (HCC) 05/21/2022   Essential hypertension 05/21/2022   Chest pain 05/20/2022   SOBOE (shortness of breath on exertion) 01/13/2019   Venous insufficiency of both lower extremities 12/14/2015   Bilateral carotid artery stenosis 12/08/2014   Moderate mitral insufficiency 12/08/2014   Moderate tricuspid insufficiency 12/08/2014   GERD (gastroesophageal reflux disease) 01/08/2014   Hypothyroidism 01/08/2014   Mixed hyperlipidemia 01/08/2014   Peripheral neuropathy 01/08/2014   Inappropriate sinus tachycardia  (HCC) 01/08/2014   Breast cancer (HCC) 04/10/2012   Trigeminal neuralgia of right side of face 10/01/2011   PCP:  Jerl Mina, MD Pharmacy:   [facility] [phone] Nicholes Rough, [facility] - [address] [place] [address] Phone: [phone] Fax: [phone]  [facility] [address] [place] [address] Phone: [phone] Fax: [phone]     Social Drivers of Health (SDOH) Social History: SDOH Screenings   Food Insecurity: No Food Insecurity (02/11/2024)  Housing: Unknown (02/11/2024)  Transportation Needs: No Transportation Needs (02/11/2024)  Utilities: Not At Risk (02/11/2024)  Financial Resource Strain: Patient Declined (08/15/2023)   Received from [facility]  Social Connections: Moderately Integrated (02/11/2024)  Tobacco Use: Low Risk  (02/10/2024)   SDOH Interventions: Housing Interventions: Patient Declined Utilities Interventions: Patient Declined Social Connections Interventions: Patient Declined   Readmission Risk Interventions     No data to display

## 2024-02-11 NOTE — IPAL (Signed)
  Interdisciplinary Goals of Care Family Meeting   Date carried out: 02/11/2024  Location of the meeting: Bedside  Member's involved: Physician and Family Member or next of kin  Durable Power of Attorney or Environmental health practitioner: Son  Discussion: We discussed goals of care for Bonnie Barker   Code status:   Code Status: Full Code   Disposition: Home with Hospice  Time spent for the meeting: 35 minutes    Delfino Lovett, MD  02/11/2024, 10:57 AM

## 2024-02-11 NOTE — ED Notes (Signed)
 Fall precautions in place for Pt. This RN placed fall band, fall grip socks, bed alarm and fall sign.

## 2024-02-11 NOTE — Plan of Care (Signed)
 Pt ready for discharge . All discharge instructions completed with patient and family @# bedside. Patient and family denies having any questions regarding AVS.  PIV discontinued with pt tolerating well. Transport here to take pt down for discharge. Bonnie Snide RN   Problem: Education: Goal: Knowledge of General Education information will improve Description: Including pain rating scale, medication(s)/side effects and non-pharmacologic comfort measures Outcome: Adequate for Discharge   Problem: Health Behavior/Discharge Planning: Goal: Ability to manage health-related needs will improve Outcome: Adequate for Discharge   Problem: Clinical Measurements: Goal: Ability to maintain clinical measurements within normal limits will improve Outcome: Adequate for Discharge Goal: Will remain free from infection Outcome: Adequate for Discharge Goal: Diagnostic test results will improve Outcome: Adequate for Discharge Goal: Respiratory complications will improve Outcome: Adequate for Discharge Goal: Cardiovascular complication will be avoided Outcome: Adequate for Discharge   Problem: Activity: Goal: Risk for activity intolerance will decrease Outcome: Adequate for Discharge   Problem: Nutrition: Goal: Adequate nutrition will be maintained Outcome: Adequate for Discharge   Problem: Coping: Goal: Level of anxiety will decrease Outcome: Adequate for Discharge   Problem: Elimination: Goal: Will not experience complications related to bowel motility Outcome: Adequate for Discharge Goal: Will not experience complications related to urinary retention Outcome: Adequate for Discharge   Problem: Pain Managment: Goal: General experience of comfort will improve and/or be controlled Outcome: Adequate for Discharge   Problem: Safety: Goal: Ability to remain free from injury will improve Outcome: Adequate for Discharge   Problem: Skin Integrity: Goal: Risk for impaired skin integrity will  decrease Outcome: Adequate for Discharge

## 2024-02-12 ENCOUNTER — Other Ambulatory Visit: Payer: Self-pay

## 2024-02-13 LAB — VITAMIN B1: Vitamin B1 (Thiamine): 129.8 nmol/L (ref 66.5–200.0)

## 2024-02-26 ENCOUNTER — Other Ambulatory Visit: Payer: Self-pay | Admitting: Otolaryngology

## 2024-02-26 DIAGNOSIS — M5481 Occipital neuralgia: Secondary | ICD-10-CM

## 2024-02-26 DIAGNOSIS — M542 Cervicalgia: Secondary | ICD-10-CM

## 2024-02-26 DIAGNOSIS — G501 Atypical facial pain: Secondary | ICD-10-CM

## 2024-02-28 ENCOUNTER — Inpatient Hospital Stay
Admission: EM | Admit: 2024-02-28 | Discharge: 2024-03-07 | DRG: 640 | Disposition: A | Discharge status: Hospice | Attending: Internal Medicine | Admitting: Internal Medicine

## 2024-02-28 ENCOUNTER — Emergency Department: Payer: Self-pay

## 2024-02-28 ENCOUNTER — Other Ambulatory Visit: Payer: Self-pay

## 2024-02-28 DIAGNOSIS — I951 Orthostatic hypotension: Secondary | ICD-10-CM | POA: Diagnosis present

## 2024-02-28 DIAGNOSIS — R253 Fasciculation: Secondary | ICD-10-CM | POA: Diagnosis present

## 2024-02-28 DIAGNOSIS — Z961 Presence of intraocular lens: Secondary | ICD-10-CM | POA: Diagnosis present

## 2024-02-28 DIAGNOSIS — Z91018 Allergy to other foods: Secondary | ICD-10-CM

## 2024-02-28 DIAGNOSIS — K638219 Small intestinal bacterial overgrowth, unspecified: Secondary | ICD-10-CM | POA: Diagnosis present

## 2024-02-28 DIAGNOSIS — G9341 Metabolic encephalopathy: Secondary | ICD-10-CM | POA: Diagnosis present

## 2024-02-28 DIAGNOSIS — Z888 Allergy status to other drugs, medicaments and biological substances status: Secondary | ICD-10-CM

## 2024-02-28 DIAGNOSIS — E7800 Pure hypercholesterolemia, unspecified: Secondary | ICD-10-CM | POA: Diagnosis present

## 2024-02-28 DIAGNOSIS — E871 Hypo-osmolality and hyponatremia: Secondary | ICD-10-CM | POA: Diagnosis not present

## 2024-02-28 DIAGNOSIS — Z880 Allergy status to penicillin: Secondary | ICD-10-CM

## 2024-02-28 DIAGNOSIS — G2581 Restless legs syndrome: Secondary | ICD-10-CM | POA: Diagnosis present

## 2024-02-28 DIAGNOSIS — H918X3 Other specified hearing loss, bilateral: Secondary | ICD-10-CM | POA: Diagnosis present

## 2024-02-28 DIAGNOSIS — Z9010 Acquired absence of unspecified breast and nipple: Secondary | ICD-10-CM

## 2024-02-28 DIAGNOSIS — I10 Essential (primary) hypertension: Secondary | ICD-10-CM | POA: Diagnosis present

## 2024-02-28 DIAGNOSIS — K219 Gastro-esophageal reflux disease without esophagitis: Secondary | ICD-10-CM | POA: Diagnosis present

## 2024-02-28 DIAGNOSIS — Z885 Allergy status to narcotic agent status: Secondary | ICD-10-CM

## 2024-02-28 DIAGNOSIS — R636 Underweight: Secondary | ICD-10-CM | POA: Diagnosis present

## 2024-02-28 DIAGNOSIS — Z79899 Other long term (current) drug therapy: Secondary | ICD-10-CM

## 2024-02-28 DIAGNOSIS — Z7982 Long term (current) use of aspirin: Secondary | ICD-10-CM

## 2024-02-28 DIAGNOSIS — G629 Polyneuropathy, unspecified: Secondary | ICD-10-CM | POA: Diagnosis present

## 2024-02-28 DIAGNOSIS — F32A Depression, unspecified: Secondary | ICD-10-CM | POA: Diagnosis present

## 2024-02-28 DIAGNOSIS — Z90710 Acquired absence of both cervix and uterus: Secondary | ICD-10-CM

## 2024-02-28 DIAGNOSIS — Z91013 Allergy to seafood: Secondary | ICD-10-CM

## 2024-02-28 DIAGNOSIS — E039 Hypothyroidism, unspecified: Secondary | ICD-10-CM | POA: Diagnosis present

## 2024-02-28 DIAGNOSIS — F419 Anxiety disorder, unspecified: Secondary | ICD-10-CM | POA: Diagnosis present

## 2024-02-28 DIAGNOSIS — I2510 Atherosclerotic heart disease of native coronary artery without angina pectoris: Secondary | ICD-10-CM | POA: Insufficient documentation

## 2024-02-28 DIAGNOSIS — K5900 Constipation, unspecified: Secondary | ICD-10-CM | POA: Diagnosis present

## 2024-02-28 DIAGNOSIS — Z881 Allergy status to other antibiotic agents status: Secondary | ICD-10-CM

## 2024-02-28 DIAGNOSIS — Z853 Personal history of malignant neoplasm of breast: Secondary | ICD-10-CM

## 2024-02-28 DIAGNOSIS — R42 Dizziness and giddiness: Secondary | ICD-10-CM

## 2024-02-28 DIAGNOSIS — Z681 Body mass index (BMI) 19 or less, adult: Secondary | ICD-10-CM

## 2024-02-28 DIAGNOSIS — W19XXXA Unspecified fall, initial encounter: Principal | ICD-10-CM | POA: Diagnosis present

## 2024-02-28 DIAGNOSIS — R531 Weakness: Secondary | ICD-10-CM

## 2024-02-28 DIAGNOSIS — Z79890 Hormone replacement therapy (postmenopausal): Secondary | ICD-10-CM

## 2024-02-28 LAB — CBC WITH DIFFERENTIAL/PLATELET
Abs Immature Granulocytes: 0.02 10*3/uL (ref 0.00–0.07)
Basophils Absolute: 0.1 10*3/uL (ref 0.0–0.1)
Basophils Relative: 1 %
Eosinophils Absolute: 0.1 10*3/uL (ref 0.0–0.5)
Eosinophils Relative: 2 %
HCT: 41.1 % (ref 36.0–46.0)
Hemoglobin: 14.1 g/dL (ref 12.0–15.0)
Immature Granulocytes: 0 %
Lymphocytes Relative: 24 %
Lymphs Abs: 1.3 10*3/uL (ref 0.7–4.0)
MCH: 30.5 pg (ref 26.0–34.0)
MCHC: 34.3 g/dL (ref 30.0–36.0)
MCV: 89 fL (ref 80.0–100.0)
Monocytes Absolute: 0.6 10*3/uL (ref 0.1–1.0)
Monocytes Relative: 10 %
Neutro Abs: 3.4 10*3/uL (ref 1.7–7.7)
Neutrophils Relative %: 63 %
Platelets: 217 10*3/uL (ref 150–400)
RBC: 4.62 MIL/uL (ref 3.87–5.11)
RDW: 11.8 % (ref 11.5–15.5)
WBC: 5.5 10*3/uL (ref 4.0–10.5)
nRBC: 0 % (ref 0.0–0.2)

## 2024-02-28 LAB — COMPREHENSIVE METABOLIC PANEL WITH GFR
ALT: 19 U/L (ref 0–44)
AST: 37 U/L (ref 15–41)
Albumin: 4.1 g/dL (ref 3.5–5.0)
Alkaline Phosphatase: 54 U/L (ref 38–126)
Anion gap: 9 (ref 5–15)
BUN: 18 mg/dL (ref 8–23)
CO2: 22 mmol/L (ref 22–32)
Calcium: 9.6 mg/dL (ref 8.9–10.3)
Chloride: 92 mmol/L — ABNORMAL LOW (ref 98–111)
Creatinine, Ser: 0.83 mg/dL (ref 0.44–1.00)
GFR, Estimated: >60 mL/min (ref >60)
Glucose, Bld: 102 mg/dL — ABNORMAL HIGH (ref 70–99)
Potassium: 4.8 mmol/L (ref 3.5–5.1)
Sodium: 123 mmol/L — ABNORMAL LOW (ref 135–145)
Total Bilirubin: 1.1 mg/dL (ref 0.0–1.2)
Total Protein: 7.3 g/dL (ref 6.5–8.1)

## 2024-02-28 LAB — URINALYSIS, ROUTINE W REFLEX MICROSCOPIC
Bilirubin Urine: NEGATIVE
Glucose, UA: NEGATIVE mg/dL
Hgb urine dipstick: NEGATIVE
Ketones, ur: 5 mg/dL — AB
Leukocytes,Ua: NEGATIVE
Nitrite: NEGATIVE
Protein, ur: NEGATIVE mg/dL
Specific Gravity, Urine: 1.01 (ref 1.005–1.030)
pH: 7 (ref 5.0–8.0)

## 2024-02-28 LAB — SODIUM: Sodium: 126 mmol/L — ABNORMAL LOW (ref 135–145)

## 2024-02-28 LAB — TROPONIN I (HIGH SENSITIVITY): Troponin I (High Sensitivity): 6 ng/L (ref <18)

## 2024-02-28 MED ORDER — ENOXAPARIN SODIUM 40 MG/0.4ML IJ SOSY
40.0000 mg | PREFILLED_SYRINGE | INTRAMUSCULAR | Status: DC
  Administered 2024-02-28 – 2024-03-06 (×8): 40 mg via SUBCUTANEOUS
  Filled 2024-02-28 (×8): qty 0.4

## 2024-02-28 MED ORDER — ONDANSETRON HCL 4 MG/2ML IJ SOLN
4.0000 mg | Freq: Four times a day (QID) | INTRAMUSCULAR | Status: DC | PRN
  Administered 2024-03-01: 4 mg via INTRAVENOUS
  Filled 2024-02-28: qty 2

## 2024-02-28 MED ORDER — ORAL CARE MOUTH RINSE: 15.0000 mL | OROMUCOSAL | Status: DC | PRN

## 2024-02-28 MED ORDER — ONDANSETRON HCL 4 MG PO TABS: 4.0000 mg | ORAL_TABLET | Freq: Four times a day (QID) | ORAL | Status: DC | PRN

## 2024-02-28 MED ORDER — ACETAMINOPHEN 325 MG PO TABS
650.0000 mg | ORAL_TABLET | Freq: Four times a day (QID) | ORAL | Status: DC | PRN
  Administered 2024-02-28 – 2024-03-04 (×10): 650 mg via ORAL
  Filled 2024-02-28 (×10): qty 2

## 2024-02-28 MED ORDER — SODIUM CHLORIDE 0.9 % IV SOLN: INTRAVENOUS | Status: DC

## 2024-02-28 MED ORDER — SERTRALINE HCL 50 MG PO TABS
75.0000 mg | ORAL_TABLET | Freq: Every day | ORAL | Status: DC
  Administered 2024-02-28 – 2024-03-06 (×8): 75 mg via ORAL
  Filled 2024-02-28 (×8): qty 2

## 2024-02-28 MED ORDER — SODIUM CHLORIDE 1 G PO TABS: 1.0000 g | ORAL_TABLET | Freq: Two times a day (BID) | ORAL | 0 refills | Status: DC

## 2024-02-28 MED ORDER — PANTOPRAZOLE SODIUM 40 MG PO TBEC
40.0000 mg | DELAYED_RELEASE_TABLET | Freq: Every day | ORAL | Status: DC
  Administered 2024-02-28 – 2024-03-07 (×9): 40 mg via ORAL
  Filled 2024-02-28 (×9): qty 1

## 2024-02-28 MED ORDER — SULFAMETHOXAZOLE-TRIMETHOPRIM 800-160 MG PO TABS
1.0000 | ORAL_TABLET | Freq: Two times a day (BID) | ORAL | Status: DC
  Administered 2024-02-28: 1 via ORAL
  Filled 2024-02-28 (×2): qty 1

## 2024-02-28 NOTE — Assessment & Plan Note (Addendum)
 Holding blood pressure medications.  Patient's blood pressure did go up after 0 minutes standing but dropped after 3 minutes standing.

## 2024-02-28 NOTE — Assessment & Plan Note (Addendum)
 Weakness Fall Sodium 127.  Increase salt tablets to 3 times daily dosing.

## 2024-02-28 NOTE — H&P (Addendum)
 History and Physical    Patient: Bonnie Barker YNW:[id number] DOB: Feb 10, 1929 DOA: 02/28/2024 DOS: the patient was seen and examined on 02/28/2024 PCP: Lyle San, MD  Patient coming from: Home  Chief Complaint:  Chief Complaint  Patient presents with   Fall   Weakness   HPI: Bonnie Barker is a 89 y.o. female with medical history significant of non-obstructive CAD, SIBO on Bactrim , HTN, HLD, hypothyroidism presenting with weakness, fall, hyponatremia.  Patient reports falling attempted going to bathroom today.  No reported LOC.  Positive head abrasion.  Noted to have been admitted April 7 through 8 4 8  for similar issues including near syncope as well as AKI.  Family reports overall decreased p.o. intake.  Has had recurrent bowel movements associated with SIBO.  No reported recent medication changes from discharge.  No fevers or chills.  Positive overall weakness.  Was also discharged with hospice.  Family still in process of working with hospice. Presented to the ER afebrile, hemodynamically stable.  Satting well on room air.  White count 5.5, hemoglobin 14.1, platelets 217.  Creatinine 0.83.  Sodium 123. CT head, CT C spine and CXR WNL.  Review of Systems: As mentioned in the history of present illness. All other systems reviewed and are negative. Past Medical History:  Diagnosis Date   Anxiety    Breast cancer (HCC)    Bronchitis    Carotid stenosis    bilateral   Chronic cough    Depression    Diverticulitis    Diverticulosis    Environmental allergies    HOH (hard of hearing)    Hypercholesteremia    Hypertension    Hypothyroidism    Neuropathy    Osteoporosis    Palpitations    Sinus trouble    Tachycardia    Trigeminal neuralgia    Past Surgical History:  Procedure Laterality Date   ABDOMINAL HYSTERECTOMY     CATARACT EXTRACTION W/PHACO Right 05/03/2015   Procedure: CATARACT EXTRACTION PHACO AND INTRAOCULAR LENS PLACEMENT (IOC);  Surgeon: Clair Crews,  MD;  Location: [facility] ORS;  Service: Ophthalmology;  Laterality: Right;  US  00:54 AP% 18.8 CDE 10.21 fluid pack lot # [id number]   CATARACT EXTRACTION W/PHACO Left 05/17/2015   Procedure: CATARACT EXTRACTION PHACO AND INTRAOCULAR LENS PLACEMENT (IOC);  Surgeon: Clair Crews, MD;  Location: [facility] ORS;  Service: Ophthalmology;  Laterality: Left;  US  01:00.5 AP% 17.8 CDE 10.76 fluid pack lot # [id number]   MASTECTOMY     THYROIDECTOMY, PARTIAL     Social History:  reports that she has never smoked. She has never used smokeless tobacco. She reports that she does not drink alcohol and does not use drugs.  Allergies  Allergen Reactions   Acthib [Haemophilus B Polysacc Tetanus Toxoid Conj Vaccine]    Apap [Acetaminophen ]    Banana Concentrate [Banana]    Benadryl [Diphenhydramine Hcl (Sleep)]    Bentyl [Dicyclomine Hcl]    Biaxin [Clarithromycin]    Codeine    Eryc [Erythromycin]    Etodolac    Flagyl [Metronidazole]    Fosamax [Alendronate Sodium]    Penicillins    Shellfish Allergy    Statins    Tetracyclines & Related    Tramadol    Vibramycin [Doxycycline Calcium]     History reviewed. No pertinent family history.  Prior to Admission medications   Medication Sig Start Date End Date Taking? Authorizing Provider  sodium chloride  1 g tablet Take 1 tablet (1 g total) by mouth 2 (two)  times daily with a meal for 5 days. 02/28/24 03/04/24 Yes Twilla Galea, MD  ascorbic acid  (VITAMIN C ) 500 MG tablet Take 500 mg by mouth daily.    [provider]  aspirin  81 MG tablet Take 81 mg by mouth daily.    [provider]  cholecalciferol  (VITAMIN D3) 25 MCG (1000 UNIT) tablet Take 1,000 Units by mouth daily.    [provider]  irbesartan  (AVAPRO ) 75 MG tablet Take 0.5 tablets (37.5 mg total) by mouth daily. Patient taking differently: Take 75 mg by mouth daily. 05/22/22   Donaciano Frizzle, MD  isosorbide  mononitrate (IMDUR ) 30 MG 24 hr tablet Take 30 mg by mouth daily.  02/10/24   [provider]  levothyroxine  (SYNTHROID , LEVOTHROID) 75 MCG tablet Take 75 mcg by mouth daily before breakfast.    [provider]  loratadine  (CLARITIN ) 10 MG tablet Take 10 mg by mouth daily.    [provider]  metoprolol  tartrate (LOPRESSOR ) 100 MG tablet Take 1 tablet (100 mg total) by mouth once for 1 dose. Please take one time dose 100mg  metoprolol  tartrate 2 hr prior to cardiac CT for HR control IF HR >55bpm. 05/29/22 05/29/22  Constancia Delton, MD  Multiple Vitamin (MULTIVITAMIN) tablet Take 1 tablet by mouth daily.    [provider]  pantoprazole  (PROTONIX ) 40 MG tablet Take 40 mg by mouth daily.    [provider]  sertraline  (ZOLOFT ) 50 MG tablet Take 75 mg by mouth at bedtime. 08/15/23 08/14/24  [provider]  simethicone  (MYLICON) 80 MG chewable tablet Chew 1 tablet (80 mg total) by mouth every 6 (six) hours as needed for flatulence (gas pain). 02/11/24   Brenna Cam, MD  sulfamethoxazole -trimethoprim  (BACTRIM  DS) 800-160 MG tablet Take 1 tablet by mouth 2 (two) times daily. 01/30/24   [provider]    Physical Exam: Vitals:   02/28/24 0709 02/28/24 0710 02/28/24 0900 02/28/24 0930  BP: (!) 163/106  136/78 (!) 154/82  Pulse: 98 97 75 75  Resp: (!) 35 18 18 (!) 22  Temp:  97.9 F (36.6 C)    TempSrc:  Oral    SpO2: 99% 100% 92% 98%  Weight:      Height:       Physical Exam Constitutional:      Comments: Underweight, NAD   HENT:     Head: Normocephalic and atraumatic.     Nose: Nose normal.     Mouth/Throat:     Mouth: Mucous membranes are dry.  Eyes:     Pupils: Pupils are equal, round, and reactive to light.  Cardiovascular:     Rate and Rhythm: Normal rate and regular rhythm.  Pulmonary:     Effort: Pulmonary effort is normal.  Abdominal:     General: Bowel sounds are normal.  Musculoskeletal:        General: Normal range of motion.  Skin:    General: Skin is dry.  Neurological:      General: No focal deficit present.  Psychiatric:        Mood and Affect: Mood normal.     Data Reviewed:  There are no new results to review at this time.  DG Chest 2 View CLINICAL DATA:  Weakness, fall.  EXAM: CHEST - 2 VIEW  COMPARISON:  May 20, 2022.  FINDINGS: The heart size and mediastinal contours are within normal limits. Both lungs are clear. The visualized skeletal structures are unremarkable.  IMPRESSION: No active cardiopulmonary disease.  Electronically  Signed   By: Rosalene Colon M.D.   On: 02/28/2024 08:23 CT Cervical Spine Wo Contrast CLINICAL DATA:  Neck trauma.  Fall in bathroom  EXAM: CT HEAD WITHOUT CONTRAST  CT CERVICAL SPINE WITHOUT CONTRAST  TECHNIQUE: Multidetector CT imaging of the head and cervical spine was performed following the standard protocol without intravenous contrast. Multiplanar CT image reconstructions of the cervical spine were also generated.  RADIATION DOSE REDUCTION: This exam was performed according to the departmental dose-optimization program which includes automated exposure control, adjustment of the mA and/or kV according to patient size and/or use of iterative reconstruction technique.  COMPARISON:  None Available.  FINDINGS: CT HEAD FINDINGS  Brain: No evidence of acute infarction, hemorrhage, hydrocephalus, extra-axial collection or mass lesion/mass effect. Generalized atrophy and widespread chronic small vessel ischemia in the cerebral white matter.  Vascular: No hyperdense vessel or unexpected calcification.  Skull: No acute fracture  Sinuses/Orbits: No evidence of injury  CT CERVICAL SPINE FINDINGS  Alignment: No traumatic malalignment  Skull base and vertebrae: No acute fracture. No primary bone lesion or focal pathologic process.  Soft tissues and spinal canal: No prevertebral fluid or swelling. No visible canal hematoma.  Disc levels:  Ordinary degenerative change  Upper chest:  Biapical reticulation and volume loss attributed to scarring, stable from 2023 chest CT  IMPRESSION: No evidence of acute intracranial or cervical spine injury.  Electronically Signed   By: Ronnette Coke M.D.   On: 02/28/2024 07:40 CT Head Wo Contrast CLINICAL DATA:  Neck trauma.  Fall in bathroom  EXAM: CT HEAD WITHOUT CONTRAST  CT CERVICAL SPINE WITHOUT CONTRAST  TECHNIQUE: Multidetector CT imaging of the head and cervical spine was performed following the standard protocol without intravenous contrast. Multiplanar CT image reconstructions of the cervical spine were also generated.  RADIATION DOSE REDUCTION: This exam was performed according to the departmental dose-optimization program which includes automated exposure control, adjustment of the mA and/or kV according to patient size and/or use of iterative reconstruction technique.  COMPARISON:  None Available.  FINDINGS: CT HEAD FINDINGS  Brain: No evidence of acute infarction, hemorrhage, hydrocephalus, extra-axial collection or mass lesion/mass effect. Generalized atrophy and widespread chronic small vessel ischemia in the cerebral white matter.  Vascular: No hyperdense vessel or unexpected calcification.  Skull: No acute fracture  Sinuses/Orbits: No evidence of injury  CT CERVICAL SPINE FINDINGS  Alignment: No traumatic malalignment  Skull base and vertebrae: No acute fracture. No primary bone lesion or focal pathologic process.  Soft tissues and spinal canal: No prevertebral fluid or swelling. No visible canal hematoma.  Disc levels:  Ordinary degenerative change  Upper chest: Biapical reticulation and volume loss attributed to scarring, stable from 2023 chest CT  IMPRESSION: No evidence of acute intracranial or cervical spine injury.  Electronically Signed   By: Ronnette Coke M.D.   On: 02/28/2024 07:40  Lab Results  Component Value Date   WBC 5.5 02/28/2024   HGB 14.1 02/28/2024    HCT 41.1 02/28/2024   MCV 89.0 02/28/2024   PLT 217 02/28/2024   Last metabolic panel Lab Results  Component Value Date   GLUCOSE 102 (H) 02/28/2024   NA 123 (L) 02/28/2024   K 4.8 02/28/2024   CL 92 (L) 02/28/2024   CO2 22 02/28/2024   BUN 18 02/28/2024   CREATININE 0.83 02/28/2024   GFRNONAA >60 02/28/2024   CALCIUM 9.6 02/28/2024   PROT 7.3 02/28/2024   ALBUMIN 4.1 02/28/2024   BILITOT 1.1 02/28/2024  ALKPHOS 54 02/28/2024   AST 37 02/28/2024   ALT 19 02/28/2024   ANIONGAP 9 02/28/2024    Assessment and Plan: * Hyponatremia Weakness Fall Symptomatic hyponatremia with sodium of 123 on presentation with worsening decompensated weakness Appears to be acute on chronic issue with noted sodium was recently between 128-130 Suspected recurrent decreased po intake is a major confounding issue  Will place on gentle IV fluid hydration Trend sodium Check urine and serum studies Monitor  Small intestinal bacterial overgrowth (SIBO) Cont bactrim     Essential hypertension BP stable Touch home regimen  Hypothyroidism Cont synthroid    CAD (coronary artery disease) Baseline history of CAD No active chest pain Continue home medications including aspirin  and statin Monitor   GERD (gastroesophageal reflux disease) PPI      Advance Care Planning:   Code Status: Full Code   Consults: None   Family Communication: Daughter at the bedside   Severity of Illness: The appropriate patient status for this patient is OBSERVATION. Observation status is judged to be reasonable and necessary in order to provide the required intensity of service to ensure the patient's safety. The patient's presenting symptoms, physical exam findings, and initial radiographic and laboratory data in the context of their medical condition is felt to place them at decreased risk for further clinical deterioration. Furthermore, it is anticipated that the patient will be medically stable for discharge  from the hospital within 2 midnights of admission.   Author: Corrinne Din, MD 02/28/2024 10:31 AM  For on call review [email or web address].

## 2024-02-28 NOTE — Assessment & Plan Note (Addendum)
 Continue Synthroid

## 2024-02-28 NOTE — ED Notes (Signed)
 Pt and family report that upon further discussion, they would like for her to be admitted. Dr. Cleora Daft made aware and will go talk to patient and family.

## 2024-02-28 NOTE — ED Triage Notes (Signed)
 To ER from home via EMS for report of fall walking to bathroom due to feeling weak. Abrasion to face. No LOC, no thinners.   4mg  IV Zofran  given by EMS.

## 2024-02-28 NOTE — Assessment & Plan Note (Addendum)
 Changed over to Xifaxan .  Patient's daughter stating they are getting a prescription from [place].  Discontinued Bactrim .

## 2024-02-28 NOTE — Assessment & Plan Note (Deleted)
 Baseline history of CAD No active chest pain Continue home medications including aspirin  and statin Monitor

## 2024-02-28 NOTE — Assessment & Plan Note (Signed)
 PPI ?

## 2024-02-28 NOTE — Progress Notes (Signed)
 [facility] Hospitalized Patient  [name] is a current hospice followed at home for terminal diagnosis of Abnormal Weight Loss.  Patient was brought to the ED via EMS after she fell this morning walking to the bathroom due to weakness.  She was admitted to [facility] on 4.25.25 with diagnosis of hyponatremia.  Per Dr. Mitcheal Amy, hospice MD, this is a related hospitalization.  Met with patient, 2 nieces, her son and daughter at the bedside.  Patient in very good spirits.  Bruising and swelling to her forehead and abrasions on the side of her nose.  She has complaints of neck pain and said the MD is aware.  Patient is Day 1 of admission.  Expect approx 2 days in the hospital to get her sodium levels up.  Patient plans to return home when ready for discharge with hospice services.  I did speak with them about STR and hospice Medicare benefit.  PT is supposed to do a consult on the patient.  If PT recommends STR, that is something they would maybe consider.  All verbalized understanding of hospice revocation to seek aggressive short term rehab form would need to be signed if this is the discharge plan they choose.  Vital Signs:  T 97.9 oral, BP 163/106, P 98, R 35  Oxi 99% on RA  I&O:  none reported  Abnormal labs:   Na 123, Cl 92, Glucose 102 Ua- 5+ Ketones  Diagnostics:   DG Chest 2 View CLINICAL DATA:  Weakness, fall.   EXAM: CHEST - 2 VIEW   COMPARISON:  May 20, 2022.   FINDINGS: The heart size and mediastinal contours are within normal limits. Both lungs are clear. The visualized skeletal structures are unremarkable.   IMPRESSION: No active cardiopulmonary disease.   Electronically Signed   By: Rosalene Colon M.D.   On: 02/28/2024 08:23  CT Cervical Spine Wo Contrast CLINICAL DATA:  Neck trauma.  Fall in bathroom   EXAM: CT HEAD WITHOUT CONTRAST   CT CERVICAL SPINE WITHOUT CONTRAST   TECHNIQUE: Multidetector CT  imaging of the head and cervical spine was performed following the standard protocol without intravenous contrast. Multiplanar CT image reconstructions of the cervical spine were also generated.   RADIATION DOSE REDUCTION: This exam was performed according to the departmental dose-optimization program which includes automated exposure control, adjustment of the mA and/or kV according to patient size and/or use of iterative reconstruction technique.   COMPARISON:  None Available.   FINDINGS: CT HEAD FINDINGS   Brain: No evidence of acute infarction, hemorrhage, hydrocephalus, extra-axial collection or mass lesion/mass effect. Generalized atrophy and widespread chronic small vessel ischemia in the cerebral white matter.   Vascular: No hyperdense vessel or unexpected calcification.   Skull: No acute fracture   Sinuses/Orbits: No evidence of injury   CT CERVICAL SPINE FINDINGS   Alignment: No traumatic malalignment   Skull base and vertebrae: No acute fracture. No primary bone lesion or focal pathologic process.   Soft tissues and spinal canal: No prevertebral fluid or swelling. No visible canal hematoma.   Disc levels:  Ordinary degenerative change   Upper chest: Biapical reticulation and volume loss attributed to scarring, stable from 2023 chest CT   IMPRESSION: No evidence of acute intracranial or cervical spine injury.   Electronically Signed   By: Ronnette Coke M.D.   On: 02/28/2024 07:40  CT Head Wo Contrast CLINICAL DATA:  Neck trauma.  Fall in bathroom  EXAM: CT HEAD WITHOUT CONTRAST   CT CERVICAL SPINE WITHOUT CONTRAST   TECHNIQUE: Multidetector CT imaging of the head and cervical spine was performed following the standard protocol without intravenous contrast. Multiplanar CT image reconstructions of the cervical spine were also generated.   RADIATION DOSE REDUCTION: This exam was performed according to the departmental dose-optimization program  which includes automated exposure control, adjustment of the mA and/or kV according to patient size and/or use of iterative reconstruction technique.   COMPARISON:  None Available.   FINDINGS: CT HEAD FINDINGS   Brain: No evidence of acute infarction, hemorrhage, hydrocephalus, extra-axial collection or mass lesion/mass effect. Generalized atrophy and widespread chronic small vessel ischemia in the cerebral white matter.   Vascular: No hyperdense vessel or unexpected calcification.   Skull: No acute fracture   Sinuses/Orbits: No evidence of injury   CT CERVICAL SPINE FINDINGS   Alignment: No traumatic malalignment   Skull base and vertebrae: No acute fracture. No primary bone lesion or focal pathologic process.   Soft tissues and spinal canal: No prevertebral fluid or swelling. No visible canal hematoma.   Disc levels:  Ordinary degenerative change   Upper chest: Biapical reticulation and volume loss attributed to scarring, stable from 2023 chest CT   IMPRESSION: No evidence of acute intracranial or cervical spine injury.   Electronically Signed   By: Ronnette Coke M.D.   On: 02/28/2024 07:40   IV/PRN Meds:  Sodium Chloride  infusion @ 12ml/hour  Hospital Problem list:    Hyponatremia        Weakness        Fall Symptomatic hyponatremia with sodium of 123 on presentation with worsening decompensated weakness Appears to be acute on chronic issue with noted sodium was recently between 120 and 130 Suspected recurrent decreased po intake is a major confounding issue  Will place on gentle IV fluid hydration Trend sodium Check urine and serum studies Monitor   2.  Essential hypertension BP stable Touch home regimen   3.  Hypothyroidism Cont synthroid     4.  CAD (coronary artery disease) Baseline history of CAD No active chest pain Continue home medications including aspirin  and statin Monitor   5.  GERD (gastroesophageal reflux  disease) PPI  Discharge Planning:  Family contact:   IDT:  Updated  Goals of Care-  Full Code  Detailed Summary Report faxed to [facility] for upload in patient's chart under 'media'.   Sharrie Deed, [place], BSN, RN, [facility] Liaison [phone]

## 2024-02-28 NOTE — ED Provider Notes (Addendum)
 [facility] Provider Note    Event Date/Time   First MD Initiated Contact with Patient 02/28/24 [phone]     (approximate)   History   Chief Complaint Fall and Weakness   HPI  Bonnie Barker is a 89 y.o. female with past medical history of hypertension, hyperlipidemia, CAD, and small intestinal bacterial overgrowth who presents to the ED for fall.  Patient reports that she was attempting to get back from the bathroom this morning when she felt so weak that her legs gave out on her and she fell to the ground.  She reports hitting her head but denies losing consciousness, does complain of headache and neck pain following the fall.  She denies any injuries to her chest, abdomen, or extremities.  She does report that she has continued to feel generally weak following admission to the hospital a couple of weeks ago.  She denies any fevers, cough, shortness of breath, vomiting, diarrhea, or dysuria.  She does report that she has been dealing with nausea and "a lot of gas" which she has been taking Bactrim  for due to SIBO.     Physical Exam   Triage Vital Signs: ED Triage Vitals  Encounter Vitals Group     BP 02/28/24 0704 (!) 191/109     Systolic BP Percentile --      Diastolic BP Percentile --      Pulse Rate 02/28/24 0704 80     Resp 02/28/24 0704 18     Temp --      Temp src --      SpO2 02/28/24 0704 100 %     Weight 02/28/24 0703 127 lb 3.2 oz (57.7 kg)     Height 02/28/24 0703 5\' 3"  (1.6 m)     Head Circumference --      Peak Flow --      Pain Score 02/28/24 0702 1     Pain Loc --      Pain Education --      Exclude from Growth Chart --     Most recent vital signs: Vitals:   02/28/24 0709 02/28/24 0710  BP: (!) 163/106   Pulse: 98 97  Resp: (!) 35 18  Temp:  97.9 F (36.6 C)  SpO2: 99% 100%    Constitutional: Alert and oriented. Eyes: Conjunctivae are normal. Head: Abrasions noted to frontal scalp and bridge of nose with no facial bony  tenderness. Nose: No congestion/rhinnorhea. Mouth/Throat: Mucous membranes are moist.  Neck: Midline cervical spine tenderness to palpation noted. Cardiovascular: Normal rate, regular rhythm. Grossly normal heart sounds.  2+ radial pulses bilaterally. Respiratory: Normal respiratory effort.  No retractions. Lungs CTAB.  No chest wall tenderness to palpation. Gastrointestinal: Soft and nontender. No distention. Musculoskeletal: No lower extremity tenderness nor edema.  No upper extremity bony tenderness to palpation. Neurologic:  Normal speech and language. No gross focal neurologic deficits are appreciated.    ED Results / Procedures / Treatments   Labs (all labs ordered are listed, but only abnormal results are displayed) Labs Reviewed  COMPREHENSIVE METABOLIC PANEL WITH GFR - Abnormal; Notable for the following components:      Result Value   Sodium 123 (*)    Chloride 92 (*)    Glucose, Bld 102 (*)    All other components within normal limits  URINALYSIS, ROUTINE W REFLEX MICROSCOPIC - Abnormal; Notable for the following components:   Color, Urine STRAW (*)    APPearance CLEAR (*)  Ketones, ur 5 (*)    All other components within normal limits  CBC WITH DIFFERENTIAL/PLATELET  TROPONIN I (HIGH SENSITIVITY)     EKG  ED ECG REPORT I, Twilla Galea, the attending physician, personally viewed and interpreted this ECG.   Date: 02/28/2024  EKG Time: 7:10  Rate: 98  Rhythm: normal sinus rhythm  Axis: Normal  Intervals:none  ST&T Change: None  RADIOLOGY CT head reviewed and interpreted by me with no hemorrhage or midline shift.  PROCEDURES:  Critical Care performed: No  .Critical Care  Performed by: Twilla Galea, MD Authorized by: Twilla Galea, MD   Critical care provider statement:    Critical care time (minutes):  30   Critical care time was exclusive of:  Separately billable procedures and treating other patients and teaching time   Critical care was  necessary to treat or prevent imminent or life-threatening deterioration of the following conditions:  Metabolic crisis   Critical care was time spent personally by me on the following activities:  Development of treatment plan with patient or surrogate, discussions with consultants, evaluation of patient's response to treatment, examination of patient, ordering and review of laboratory studies, ordering and review of radiographic studies, ordering and performing treatments and interventions, pulse oximetry, re-evaluation of patient's condition and review of old charts   I assumed direction of critical care for this patient from another provider in my specialty: no     Care discussed with: admitting provider      MEDICATIONS ORDERED IN ED: Medications - No data to display   IMPRESSION / MDM / ASSESSMENT AND PLAN / ED COURSE  I reviewed the triage vital signs and the nursing notes.                              89 y.o. female with past medical history of hypertension, hyperlipidemia, CAD, and SIBO who presents to the ED complaining of generalized weakness for a couple of weeks with a fall this morning where she struck her head.  Patient's presentation is most consistent with acute presentation with potential threat to life or bodily function.  Differential diagnosis includes, but is not limited to, intracranial injury, cervical spine injury, arrhythmia, ACS, anemia, electrolyte abnormality, AKI, UTI, pneumonia.  Patient chronically ill but nontoxic-appearing and in no acute distress, vital signs remarkable for hypertension but otherwise reassuring.  We will check CT imaging of her head and cervical spine, also assess for source of her worsening weakness with EKG, labs, and urinalysis.  Vital signs do not appear concerning for sepsis at this time and she has a nonfocal neurologic exam.  CT head and cervical spine are negative for acute process, chest x-ray also unremarkable.  Labs show  hyponatremia at 123, which appears to be acute on chronic for the patient with no associated AKI or LFT abnormality.  No significant anemia or leukocytosis noted, troponin within normal limits.  Family initially stated that they were not interested in admission prefer to follow-up with PCP, however on reassessment they are stating they would like for her to be admitted to the hospital to address the hyponatremia.  While she has been referred for hospice care, hospice care has not yet been initiated.      FINAL CLINICAL IMPRESSION(S) / ED DIAGNOSES   Final diagnoses:  Fall, initial encounter  Generalized weakness  Hyponatremia     Rx / DC Orders   ED Discharge Orders  Ordered    sodium chloride  1 g tablet  2 times daily with meals        02/28/24 4098             Note:  This document was prepared using Dragon voice recognition software and may include unintentional dictation errors.   Twilla Galea, MD 02/28/24 1191    Twilla Galea, MD 02/28/24 [id number]

## 2024-02-29 DIAGNOSIS — K638219 Small intestinal bacterial overgrowth, unspecified: Secondary | ICD-10-CM | POA: Diagnosis present

## 2024-02-29 DIAGNOSIS — G2581 Restless legs syndrome: Secondary | ICD-10-CM | POA: Diagnosis present

## 2024-02-29 DIAGNOSIS — Z853 Personal history of malignant neoplasm of breast: Secondary | ICD-10-CM | POA: Diagnosis not present

## 2024-02-29 DIAGNOSIS — Z9010 Acquired absence of unspecified breast and nipple: Secondary | ICD-10-CM | POA: Diagnosis not present

## 2024-02-29 DIAGNOSIS — R253 Fasciculation: Secondary | ICD-10-CM | POA: Diagnosis present

## 2024-02-29 DIAGNOSIS — F32A Depression, unspecified: Secondary | ICD-10-CM | POA: Insufficient documentation

## 2024-02-29 DIAGNOSIS — Z681 Body mass index (BMI) 19 or less, adult: Secondary | ICD-10-CM | POA: Diagnosis not present

## 2024-02-29 DIAGNOSIS — R531 Weakness: Secondary | ICD-10-CM | POA: Diagnosis not present

## 2024-02-29 DIAGNOSIS — H918X3 Other specified hearing loss, bilateral: Secondary | ICD-10-CM | POA: Diagnosis present

## 2024-02-29 DIAGNOSIS — Z881 Allergy status to other antibiotic agents status: Secondary | ICD-10-CM | POA: Diagnosis not present

## 2024-02-29 DIAGNOSIS — F419 Anxiety disorder, unspecified: Secondary | ICD-10-CM | POA: Diagnosis present

## 2024-02-29 DIAGNOSIS — W19XXXA Unspecified fall, initial encounter: Secondary | ICD-10-CM | POA: Diagnosis present

## 2024-02-29 DIAGNOSIS — I10 Essential (primary) hypertension: Secondary | ICD-10-CM | POA: Diagnosis present

## 2024-02-29 DIAGNOSIS — Z90710 Acquired absence of both cervix and uterus: Secondary | ICD-10-CM | POA: Diagnosis not present

## 2024-02-29 DIAGNOSIS — R42 Dizziness and giddiness: Secondary | ICD-10-CM

## 2024-02-29 DIAGNOSIS — Z79899 Other long term (current) drug therapy: Secondary | ICD-10-CM | POA: Diagnosis not present

## 2024-02-29 DIAGNOSIS — E039 Hypothyroidism, unspecified: Secondary | ICD-10-CM | POA: Diagnosis present

## 2024-02-29 DIAGNOSIS — I951 Orthostatic hypotension: Secondary | ICD-10-CM | POA: Diagnosis present

## 2024-02-29 DIAGNOSIS — K219 Gastro-esophageal reflux disease without esophagitis: Secondary | ICD-10-CM | POA: Diagnosis present

## 2024-02-29 DIAGNOSIS — R636 Underweight: Secondary | ICD-10-CM | POA: Diagnosis present

## 2024-02-29 DIAGNOSIS — Z79890 Hormone replacement therapy (postmenopausal): Secondary | ICD-10-CM | POA: Diagnosis not present

## 2024-02-29 DIAGNOSIS — G9341 Metabolic encephalopathy: Secondary | ICD-10-CM | POA: Diagnosis present

## 2024-02-29 DIAGNOSIS — G629 Polyneuropathy, unspecified: Secondary | ICD-10-CM | POA: Diagnosis present

## 2024-02-29 DIAGNOSIS — E7800 Pure hypercholesterolemia, unspecified: Secondary | ICD-10-CM | POA: Diagnosis present

## 2024-02-29 DIAGNOSIS — E871 Hypo-osmolality and hyponatremia: Secondary | ICD-10-CM | POA: Diagnosis present

## 2024-02-29 DIAGNOSIS — I2510 Atherosclerotic heart disease of native coronary artery without angina pectoris: Secondary | ICD-10-CM | POA: Diagnosis present

## 2024-02-29 DIAGNOSIS — K5900 Constipation, unspecified: Secondary | ICD-10-CM | POA: Diagnosis present

## 2024-02-29 LAB — COMPREHENSIVE METABOLIC PANEL WITH GFR
ALT: 17 U/L (ref 0–44)
AST: 24 U/L (ref 15–41)
Albumin: 3.7 g/dL (ref 3.5–5.0)
Alkaline Phosphatase: 51 U/L (ref 38–126)
Anion gap: 5 (ref 5–15)
BUN: 13 mg/dL (ref 8–23)
CO2: 26 mmol/L (ref 22–32)
Calcium: 9.1 mg/dL (ref 8.9–10.3)
Chloride: 96 mmol/L — ABNORMAL LOW (ref 98–111)
Creatinine, Ser: 0.78 mg/dL (ref 0.44–1.00)
GFR, Estimated: >60 mL/min (ref >60)
Glucose, Bld: 86 mg/dL (ref 70–99)
Potassium: 3.9 mmol/L (ref 3.5–5.1)
Sodium: 127 mmol/L — ABNORMAL LOW (ref 135–145)
Total Bilirubin: 0.6 mg/dL (ref 0.0–1.2)
Total Protein: 6.3 g/dL — ABNORMAL LOW (ref 6.5–8.1)

## 2024-02-29 LAB — SODIUM: Sodium: 128 mmol/L — ABNORMAL LOW (ref 135–145)

## 2024-02-29 LAB — CBC
HCT: 36.3 % (ref 36.0–46.0)
Hemoglobin: 12.6 g/dL (ref 12.0–15.0)
MCH: 30.4 pg (ref 26.0–34.0)
MCHC: 34.7 g/dL (ref 30.0–36.0)
MCV: 87.5 fL (ref 80.0–100.0)
Platelets: 219 10*3/uL (ref 150–400)
RBC: 4.15 MIL/uL (ref 3.87–5.11)
RDW: 11.8 % (ref 11.5–15.5)
WBC: 5.9 10*3/uL (ref 4.0–10.5)
nRBC: 0 % (ref 0.0–0.2)

## 2024-02-29 MED ORDER — ENSURE ENLIVE PO LIQD
237.0000 mL | Freq: Three times a day (TID) | ORAL | Status: DC
  Administered 2024-02-29 – 2024-03-07 (×20): 237 mL via ORAL

## 2024-02-29 MED ORDER — ADULT MULTIVITAMIN W/MINERALS CH
1.0000 | ORAL_TABLET | Freq: Every day | ORAL | Status: DC
  Administered 2024-02-29 – 2024-03-07 (×8): 1 via ORAL
  Filled 2024-02-29 (×8): qty 1

## 2024-02-29 MED ORDER — LORATADINE 10 MG PO TABS
10.0000 mg | ORAL_TABLET | Freq: Every day | ORAL | Status: DC
  Administered 2024-02-29 – 2024-03-07 (×8): 10 mg via ORAL
  Filled 2024-02-29 (×8): qty 1

## 2024-02-29 MED ORDER — SODIUM CHLORIDE 0.9 % IV SOLN: INTRAVENOUS | Status: DC

## 2024-02-29 MED ORDER — SIMETHICONE 80 MG PO CHEW
80.0000 mg | CHEWABLE_TABLET | Freq: Four times a day (QID) | ORAL | Status: DC | PRN
  Administered 2024-02-29 – 2024-03-02 (×5): 80 mg via ORAL
  Filled 2024-02-29 (×5): qty 1

## 2024-02-29 MED ORDER — RIFAXIMIN 550 MG PO TABS
550.0000 mg | ORAL_TABLET | Freq: Three times a day (TID) | ORAL | Status: DC
  Administered 2024-02-29 – 2024-03-07 (×21): 550 mg via ORAL
  Filled 2024-02-29 (×23): qty 1

## 2024-02-29 MED ORDER — MELATONIN 5 MG PO TABS
5.0000 mg | ORAL_TABLET | Freq: Every evening | ORAL | Status: DC | PRN
  Filled 2024-02-29: qty 1

## 2024-02-29 MED ORDER — SODIUM CHLORIDE 0.9 % IV BOLUS
500.0000 mL | Freq: Once | INTRAVENOUS | Status: AC
  Administered 2024-02-29: 500 mL via INTRAVENOUS

## 2024-02-29 MED ORDER — ISOSORBIDE MONONITRATE ER 30 MG PO TB24
30.0000 mg | ORAL_TABLET | Freq: Every day | ORAL | Status: DC
  Administered 2024-02-29 – 2024-03-07 (×8): 30 mg via ORAL
  Filled 2024-02-29 (×8): qty 1

## 2024-02-29 MED ORDER — RIFAXIMIN 200 MG PO TABS
200.0000 mg | ORAL_TABLET | Freq: Two times a day (BID) | ORAL | Status: DC
  Filled 2024-02-29: qty 1

## 2024-02-29 MED ORDER — LEVOTHYROXINE SODIUM 50 MCG PO TABS
75.0000 ug | ORAL_TABLET | Freq: Every day | ORAL | Status: DC
  Administered 2024-03-01 – 2024-03-07 (×7): 75 ug via ORAL
  Filled 2024-02-29 (×7): qty 2

## 2024-02-29 MED ORDER — ROPINIROLE HCL 0.25 MG PO TABS
0.2500 mg | ORAL_TABLET | Freq: Every day | ORAL | Status: DC
  Administered 2024-02-29 – 2024-03-01 (×2): 0.25 mg via ORAL
  Filled 2024-02-29 (×2): qty 1

## 2024-02-29 NOTE — Hospital Course (Addendum)
 89 y.o. female with medical history significant of non-obstructive CAD, SIBO on Bactrim , HTN, HLD, hypothyroidism presenting with weakness, fall, hyponatremia.  Patient reports falling attempted going to bathroom today.  No reported LOC.  Positive head abrasion.  Noted to have been admitted April 7 through 8 4 8  for similar issues including near syncope as well as AKI.  Family reports overall decreased p.o. intake.  Has had recurrent bowel movements associated with SIBO.  No reported recent medication changes from discharge.  No fevers or chills.  Positive overall weakness.  Was also discharged with hospice.  Family still in process of working with hospice. Presented to the ER afebrile, hemodynamically stable.  Satting well on room air.  White count 5.5, hemoglobin 14.1, platelets 217.  Creatinine 0.83.  Sodium 123. CT head, CT C spine and CXR WNL.   4/26.  Patient complaining of neck pain, dizziness, poor appetite, abdominal discomfort, legs jumping especially at night.  Found to have low sodium.  Today sodium up to 127. 4/27.  Patient still complaining of neck pain and dizziness when moving her head.  Still having some jolting sensations in her legs and arms.  Patient orthostatic yesterday.  Given IV fluids.  Sodium a little lower today at 126.

## 2024-02-29 NOTE — Assessment & Plan Note (Addendum)
 Twitching of her extremities and neck pain.  Will get MRI of the cervical spine.  Could be concussion from fall.  Did not have any room spinning.

## 2024-02-29 NOTE — Progress Notes (Signed)
 Progress Note   Patient: Bonnie Barker PPI:[id number] DOB: [birth year] DOA: 02/28/2024     0 DOS: the patient was seen and examined on 02/29/2024   Brief hospital course: 89 y.o. female with medical history significant of non-obstructive CAD, SIBO on Bactrim , HTN, HLD, hypothyroidism presenting with weakness, fall, hyponatremia.  Patient reports falling attempted going to bathroom today.  No reported LOC.  Positive head abrasion.  Noted to have been admitted April 7 through 8 4 8  for similar issues including near syncope as well as AKI.  Family reports overall decreased p.o. intake.  Has had recurrent bowel movements associated with SIBO.  No reported recent medication changes from discharge.  No fevers or chills.  Positive overall weakness.  Was also discharged with hospice.  Family still in process of working with hospice. Presented to the ER afebrile, hemodynamically stable.  Satting well on room air.  White count 5.5, hemoglobin 14.1, platelets 217.  Creatinine 0.83.  Sodium 123. CT head, CT C spine and CXR WNL.   4/26.  Patient complaining of neck pain, dizziness, poor appetite, abdominal discomfort, legs jumping especially at night.  Found to have low sodium.  Today sodium up to 127.  Assessment and Plan: * Hyponatremia Weakness Fall Last sodium up to 128.  Change diet to regular.  Discontinue Bactrim  since this may be contributing.  Encouraged to eat.  Physical therapy evaluation  Dizziness Could be concussion from fall.  Did not have any room spinning.  Could be secondary to orthostatic hypotension.  Will hold blood pressure medication.  CT scan of the head and neck negative  Small intestinal bacterial overgrowth (SIBO) Change over to Xifaxan .  Patient's daughter stating they are getting a prescription from [place].  Essential hypertension Holding blood pressure medications.  Patient's blood pressure did go up after 0 minutes standing but dropped after 3 minutes  standing.  Hypothyroidism Continue Synthroid   Anxiety and depression On Zoloft   Restless leg syndrome Trial of low-dose Requip  GERD (gastroesophageal reflux disease) PPI        Subjective: Patient with numerous complaints of neck pain, legs jumping at night, abdominal discomfort, poor appetite, dizziness.  On Bactrim  for small intestines bacterial overgrowth.  Physical Exam: Vitals:   02/28/24 1549 02/28/24 2003 02/29/24 0342 02/29/24 0810  BP: (!) 144/65 (!) 140/65 (!) 152/96 127/69  Pulse: 74 78 73 74  Resp:  16 16 17   Temp:  98.2 F (36.8 C) 98.4 F (36.9 C) 98.5 F (36.9 C)  TempSrc:    Oral  SpO2: 95% 100% 100% 98%  Weight:      Height:       Physical Exam HENT:     Head: Normocephalic.  Eyes:     General: Lids are normal.     Conjunctiva/sclera: Conjunctivae normal.  Cardiovascular:     Rate and Rhythm: Normal rate and regular rhythm.     Heart sounds: Normal heart sounds, S1 normal and S2 normal.  Pulmonary:     Breath sounds: Normal breath sounds. No decreased breath sounds, wheezing, rhonchi or rales.  Abdominal:     Palpations: Abdomen is soft.     Tenderness: There is no abdominal tenderness.  Musculoskeletal:     Right lower leg: No swelling.     Left lower leg: No swelling.  Skin:    Comments: Bruising/scrapes face, nose  Neurological:     Mental Status: She is alert and oriented to person, place, and time.     Data Reviewed:  Sodium 128, creatinine 0.78, CBC normal range  Family Communication: Daughter at bedside  Disposition: Status is: Inpatient Remains inpatient appropriate because: Since patient got dizzy with just sitting up I will have physical therapy evaluate and check orthostatic vital signs.  Planned Discharge Destination: Home with hospice    Time spent: 28 minutes  Author: Verla Glaze, MD 02/29/2024 4:59 PM  For on call review [email or web address].

## 2024-02-29 NOTE — Progress Notes (Signed)
        [facility] Hospitalized Patient   [name] is a current hospice followed at home for terminal diagnosis of Abnormal Weight Loss.  Patient was brought to the ED via EMS after she fell this morning walking to the bathroom due to weakness.  She was admitted to [facility] on 4.25.25 with diagnosis of hyponatremia.  Per Dr. Mitcheal Amy, hospice MD, this is a related hospitalization.   Patient lying in bed with her eyes closed.  Arouses to verbal stimulation.  Patient continues to complain of neck pain.  Communicated with Dr. Clelia Current regarding discharge plan.  He said it will likely be tomorrow.  Per Duke Gibbons, TOC-family does not want patient to go to [facility], but back home when medically ready.  Patient pending PT evaluation today.   Vital Signs:  T 98.5 oral, BP 127/69, P 39f, R 17  Oxi 100%on RA   I&O:  none reported   Abnormal labs:   Na 128   Diagnostics:    No New   IV/PRN Meds:  Sodium Chloride  infusion @ 127ml/hour   Hospital Problem list: per progress note Dr. Verla Glaze 4.26.25   Hyponatremia Weakness Fall Last sodium up to 128.  Change diet to regular.  Discontinue Bactrim  since this may be contributing.  Encouraged to eat.  Physical therapy evaluation   Dizziness Could be concussion from fall.  Did not have any room spinning.  Could be secondary to orthostatic hypotension.  Will hold blood pressure medication.  CT scan of the head and neck negative   Small intestinal bacterial overgrowth (SIBO) Change over to Xifaxan .  Patient's daughter stating they are getting a prescription from [place].   Essential hypertension Holding blood pressure medications.  Patient's blood pressure did go up after 0 minutes standing but dropped after 3 minutes standing.   Hypothyroidism Continue Synthroid    Anxiety and depression On Zoloft    Restless leg syndrome Trial of low-dose Requip   GERD (gastroesophageal reflux  disease) PPI   Discharge Planning: likely tomorrow Family contact:  none IDT:  Updated  Goals of Care-  Full Code   Please do not hesitate to call with any hospice related questions or concerns.  Sharrie Deed, [place], BSN, RN, [facility] Liaison [phone]

## 2024-02-29 NOTE — TOC Initial Note (Signed)
 Transition of Care [facility] - Initial/Assessment Note    Patient Details  Name: Bonnie Barker MRN: [id number] Date of Birth: [birth year]  [facility]) CM/SW Contact:    Odilia Bennett, LCSW Phone Number: 02/29/2024, 10:30 AM  Clinical Narrative:   CSW met with patient. Daughter at bedside. CSW introduced role and explained that discharge planning would be discussed. Patient is active with home hospice services through [facility]. PT eval pending but patient and daughter do not want SNF placement. No home DME needs at this time. Son is Product manager and daughter is financial POA. No further concerns. CSW will continue to follow patient for support and facilitate return home once stable.               Expected Discharge Plan: Home w Hospice Care Barriers to Discharge: Continued Medical Work up   Patient Goals and CMS Choice     Choice offered to / list presented to : Patient, Adult Children      Expected Discharge Plan and Services     Post Acute Care Choice: Resumption of Svcs/PTA Provider Living arrangements for the past 2 months: Single Family Home                                      Prior Living Arrangements/Services Living arrangements for the past 2 months: Single Family Home Lives with:: Adult Children Patient language and need for interpreter reviewed:: Yes Do you feel safe going back to the place where you live?: Yes      Need for Family Participation in Patient Care: Yes (Comment) Care giver support system in place?: Yes (comment) Current home services: Hospice Criminal Activity/Legal Involvement Pertinent to Current Situation/Hospitalization: No - Comment as needed  Activities of Daily Living   ADL Screening (condition at time of admission) Independently performs ADLs?: Yes (appropriate for developmental age) Is the patient deaf or have difficulty hearing?: Yes Does the patient have difficulty seeing, even when wearing glasses/contacts?: No Does  the patient have difficulty concentrating, remembering, or making decisions?: No  Permission Sought/Granted Permission sought to share information with : Facility Medical sales representative, Family Supports Permission granted to share information with : Yes, Verbal Permission Granted        Permission granted to share info w Relationship: Daughter     Emotional Assessment Appearance:: Appears stated age Attitude/Demeanor/Rapport: Engaged, Gracious Affect (typically observed): Accepting, Appropriate, Calm, Pleasant Orientation: : Oriented to Place, Oriented to Self, Oriented to  Time, Oriented to Situation Alcohol / Substance Use: Not Applicable Psych Involvement: No (comment)  Admission diagnosis:  Hyponatremia [E87.1] Generalized weakness [R53.1] Fall, initial encounter [W19.XXXA] Patient Active Problem List   Diagnosis Date Noted   CAD (coronary artery disease) 02/28/2024   Goals of care, counseling/discussion 02/11/2024   Hospice care patient 02/11/2024   Near syncope 02/10/2024   AKI (acute kidney injury) (HCC) 02/10/2024   Generalized weakness 02/10/2024   Small intestinal bacterial overgrowth (SIBO) 02/10/2024   Pelvic pressure in female 02/10/2024   Hyponatremia 02/10/2024   Unstable angina (HCC) 05/21/2022   Essential hypertension 05/21/2022   Chest pain 05/20/2022   SOBOE (shortness of breath on exertion) 01/13/2019   Venous insufficiency of both lower extremities 12/14/2015   Bilateral carotid artery stenosis 12/08/2014   Moderate mitral insufficiency 12/08/2014   Moderate tricuspid insufficiency 12/08/2014   GERD (gastroesophageal reflux disease) 01/08/2014   Hypothyroidism 01/08/2014   Mixed hyperlipidemia  01/08/2014   Peripheral neuropathy 01/08/2014   Inappropriate sinus tachycardia (HCC) 01/08/2014   Breast cancer (HCC) 04/10/2012   Trigeminal neuralgia of right side of face 10/01/2011   PCP:  Lyle San, MD Pharmacy:   [facility] [id number] [place],  [facility] - [address] [place] [address] Phone: [phone] Fax: [phone]  [facility] [address] [place] [address] Phone: [phone] Fax: [phone]     Social Drivers of Health (SDOH) Social History: SDOH Screenings   Food Insecurity: No Food Insecurity (02/28/2024)  Housing: Low Risk  (02/28/2024)  Transportation Needs: No Transportation Needs (02/28/2024)  Utilities: Not At Risk (02/28/2024)  Financial Resource Strain: Patient Declined (08/15/2023)   Received from [facility]  Social Connections: Socially Isolated (02/28/2024)  Tobacco Use: Low Risk  (02/28/2024)   SDOH Interventions:     Readmission Risk Interventions     No data to display

## 2024-02-29 NOTE — Care Management Obs Status (Signed)
 MEDICARE OBSERVATION STATUS NOTIFICATION   Patient Details  Name: Bonnie Barker MRN: [id number] Date of Birth: Mar 19, 1929   Medicare Observation Status Notification Given:  Yes    Odilia Bennett, LCSW 02/29/2024, 10:28 AM

## 2024-02-29 NOTE — Assessment & Plan Note (Signed)
 Trial of low-dose Requip

## 2024-02-29 NOTE — Evaluation (Signed)
 Physical Therapy Evaluation Patient Details Name: Bonnie Barker MRN: [id number] DOB: [birth year] Today's Date: 02/29/2024  History of Present Illness  Bonnie Barker is a 89 y.o. female with medical history significant of non-obstructive CAD, SIBO on Bactrim , HTN, HLD, hypothyroidism presenting with weakness, fall, hyponatremia.  Clinical Impression  89 yo Female presents with progressive weakness and recent fall; She was diagnosed with hyponatremia. Patient reports feeling episodes where she gets significantly weak and dizzy. During PT evaluation, she complained of dizziness upon sitting up with episode of trunk ataxia requiring mod A to right self to midline. PT checked orthostatics with significant drop in BP noted with prolonged standing. Patient required min A to stand edge of bed. She ambulated 20 feet holding onto dynamap (BP machine) to steady self. Pt ambulates with short shuffled steps, wide base of support with slower gait speed. She would benefit from skilled PT intervention to improve strength and mobility. RN notified of orthostatics.       If plan is discharge home, recommend the following: A little help with walking and/or transfers;A little help with bathing/dressing/bathroom;Assistance with cooking/housework;Help with stairs or ramp for entrance;Assist for transportation   Can travel by private vehicle        Equipment Recommendations None recommended by PT  Recommendations for Other Services       Functional Status Assessment Patient has had a recent decline in their functional status and demonstrates the ability to make significant improvements in function in a reasonable and predictable amount of time.     Precautions / Restrictions Precautions Precautions: Fall Restrictions Weight Bearing Restrictions Per Provider Order: No      Mobility  Bed Mobility Overal bed mobility: Needs Assistance Bed Mobility: Supine to Sit     Supine to sit: Mod assist      General bed mobility comments: elevated head of bed, physical assistance for trunk management; Pt reports increased dizziness upon sitting with episode of ataxic trunk movement requiring mod A for sitting balance. See vital signs for orthostatics.    Transfers Overall transfer level: Needs assistance Equipment used: 1 person hand held assist Transfers: Sit to/from Stand Sit to Stand: Min assist           General transfer comment: Min A for sit to stand edge of bed; pt holding onto therapist arm with both hands to steady self. See vital signs for orthostatic vitals; does exhibit significant drop with prolonged standing;    Ambulation/Gait Ambulation/Gait assistance: Min assist Gait Distance (Feet): 20 Feet Assistive device:  (pt held onto dynamap while walking around bed to recliner)   Gait velocity: decreased     General Gait Details: very short, shuffling steps, limited heel strike toe off; notable balance deficits; vitals: BP: 122/63, HR: 94  Stairs            Wheelchair Mobility     Tilt Bed    Modified Rankin (Stroke Patients Only)       Balance Overall balance assessment: Needs assistance Sitting-balance support: No upper extremity supported, Feet supported Sitting balance-Leahy Scale: Fair Sitting balance - Comments: With prolonged sitting edge of bed, patient able to maintain sitting without back support with close stand by assist; However unable to right self when feeling unsteady with significant trunk ataxia, leaning posteriorly;   Standing balance support: Bilateral upper extremity supported Standing balance-Leahy Scale: Poor Standing balance comment: Pt stood with BUE hand held support with wide base of support, occasional posterior lean requiring min A for steadying;  Pertinent Vitals/Pain Pain Assessment Pain Assessment: 0-10 Pain Score: 4  Breathing: normal Body Language: relaxed Pain Location: back of  head/neck; especially when flexing neck Pain Descriptors / Indicators: Pounding Pain Intervention(s): Limited activity within patient's tolerance, Monitored during session, Repositioned    Home Living Family/patient expects to be discharged to:: Private residence Living Arrangements: Alone (son Tiny Forest) lives close and provides daily support/assist as needed) Available Help at Discharge: Family;Available 24 hours/day Type of Home: House Home Access: Stairs to enter   [employer] of Steps: 1   Home Layout: One level Home Equipment: None Additional Comments: has access to family walker if needed    Prior Function Prior Level of Function : Independent/Modified Independent             Mobility Comments: Mod indep with ADLs (sponge bath) and household mobilization (furniture walking) Recently patient reports she will walk with assistance of her son.  Does endorse notable decline in activity tolerance, often pacing/self-limiting activities due to fatigue levels.       Extremity/Trunk Assessment   Upper Extremity Assessment Upper Extremity Assessment: Generalized weakness    Lower Extremity Assessment Lower Extremity Assessment: Generalized weakness       Communication   Communication Communication: No apparent difficulties    Cognition Arousal: Alert Behavior During Therapy: WFL for tasks assessed/performed   PT - Cognitive impairments: No apparent impairments                       PT - Cognition Comments: oriented to person, place, situation and date Following commands: Intact       Cueing Cueing Techniques: Verbal cues     General Comments General comments (skin integrity, edema, etc.): vitals monitored; pt complained of dizziness upon sitting; orthostatic vitals checked, see above; Pt does exhibit significant drop in BP with prolonged standing;    Exercises Other Exercises Other Exercises: Educated patient in role of PT and  recommendations; Other Exercises: Educated patient in upper extremity movement to help increase BP when feeling dizzy to help reduce orthostatic hypotension;   Assessment/Plan    PT Assessment Patient needs continued PT services  PT Problem List Decreased strength;Decreased balance;Decreased activity tolerance;Decreased knowledge of use of DME;Decreased safety awareness;Decreased knowledge of precautions;Cardiopulmonary status limiting activity;Decreased mobility       PT Treatment Interventions DME instruction;Gait training;Stair training;Functional mobility training;Therapeutic activities;Therapeutic exercise;Balance training;Patient/family education;Neuromuscular re-education    PT Goals (Current goals can be found in the Care Plan section)  Acute Rehab PT Goals Patient Stated Goal: to go home PT Goal Formulation: With patient Time For Goal Achievement: 03/14/24 Potential to Achieve Goals: Fair    Frequency Min 2X/week     Co-evaluation               AM-PAC PT "6 Clicks" Mobility  Outcome Measure Help needed turning from your back to your side while in a flat bed without using bedrails?: None Help needed moving from lying on your back to sitting on the side of a flat bed without using bedrails?: A Little Help needed moving to and from a bed to a chair (including a wheelchair)?: A Little Help needed standing up from a chair using your arms (e.g., wheelchair or bedside chair)?: A Little Help needed to walk in hospital room?: A Little Help needed climbing 3-5 steps with a railing? : A Lot 6 Click Score: 18    End of Session Equipment Utilized During Treatment: Gait belt Activity Tolerance: Patient tolerated treatment  well Patient left: with call bell/phone within reach;with family/visitor present;in chair;with chair alarm set Nurse Communication: Mobility status PT Visit Diagnosis: Unsteadiness on feet (R26.81);Muscle weakness (generalized) (M62.81)    Time:  4098-1191 PT Time Calculation (min) (ACUTE ONLY): 54 min   Charges:   PT Evaluation $PT Eval Moderate Complexity: 1 Mod   PT General Charges $$ ACUTE PT VISIT: 1 Visit          Tico Crotteau PT, DPT 02/29/2024, 5:09 PM

## 2024-02-29 NOTE — Plan of Care (Signed)
   Problem: Education: Goal: Knowledge of General Education information will improve Description Including pain rating scale, medication(s)/side effects and non-pharmacologic comfort measures Outcome: Progressing   Problem: Health Behavior/Discharge Planning: Goal: Ability to manage health-related needs will improve Outcome: Progressing

## 2024-02-29 NOTE — Assessment & Plan Note (Signed)
 On Zoloft , added Ativan .

## 2024-03-01 ENCOUNTER — Inpatient Hospital Stay: Payer: Self-pay

## 2024-03-01 DIAGNOSIS — R42 Dizziness and giddiness: Secondary | ICD-10-CM | POA: Diagnosis not present

## 2024-03-01 DIAGNOSIS — K638219 Small intestinal bacterial overgrowth, unspecified: Secondary | ICD-10-CM | POA: Diagnosis not present

## 2024-03-01 DIAGNOSIS — E871 Hypo-osmolality and hyponatremia: Secondary | ICD-10-CM | POA: Diagnosis not present

## 2024-03-01 DIAGNOSIS — I951 Orthostatic hypotension: Secondary | ICD-10-CM | POA: Diagnosis not present

## 2024-03-01 LAB — BASIC METABOLIC PANEL WITH GFR
Anion gap: 4 — ABNORMAL LOW (ref 5–15)
BUN: 13 mg/dL (ref 8–23)
CO2: 25 mmol/L (ref 22–32)
Calcium: 8.8 mg/dL — ABNORMAL LOW (ref 8.9–10.3)
Chloride: 97 mmol/L — ABNORMAL LOW (ref 98–111)
Creatinine, Ser: 0.82 mg/dL (ref 0.44–1.00)
GFR, Estimated: >60 mL/min (ref >60)
Glucose, Bld: 96 mg/dL (ref 70–99)
Potassium: 4.1 mmol/L (ref 3.5–5.1)
Sodium: 126 mmol/L — ABNORMAL LOW (ref 135–145)

## 2024-03-01 MED ORDER — SODIUM CHLORIDE 1 G PO TABS
1.0000 g | ORAL_TABLET | Freq: Three times a day (TID) | ORAL | Status: DC
  Administered 2024-03-01 (×2): 1 g via ORAL
  Filled 2024-03-01 (×2): qty 1

## 2024-03-01 MED ORDER — TRIPLE ANTIBIOTIC 3.5-400-5000 EX OINT
1.0000 | TOPICAL_OINTMENT | Freq: Three times a day (TID) | CUTANEOUS | Status: DC
  Administered 2024-03-01 – 2024-03-07 (×18): 1 via CUTANEOUS
  Filled 2024-03-01 (×20): qty 1

## 2024-03-01 NOTE — Assessment & Plan Note (Signed)
 Patient given fluid bolus yesterday and IV fluids overnight.  Not orthostatic this morning.

## 2024-03-01 NOTE — Progress Notes (Signed)
 Progress Note   Patient: Bonnie Barker ZOX:[id number] DOB: [birth year] DOA: 02/28/2024     1 DOS: the patient was seen and examined on 03/01/2024   Brief hospital course: 89 y.o. female with medical history significant of non-obstructive CAD, SIBO on Bactrim , HTN, HLD, hypothyroidism presenting with weakness, fall, hyponatremia.  Patient reports falling attempted going to bathroom today.  No reported LOC.  Positive head abrasion.  Noted to have been admitted April 7 through 8 4 8  for similar issues including near syncope as well as AKI.  Family reports overall decreased p.o. intake.  Has had recurrent bowel movements associated with SIBO.  No reported recent medication changes from discharge.  No fevers or chills.  Positive overall weakness.  Was also discharged with hospice.  Family still in process of working with hospice. Presented to the ER afebrile, hemodynamically stable.  Satting well on room air.  White count 5.5, hemoglobin 14.1, platelets 217.  Creatinine 0.83.  Sodium 123. CT head, CT C spine and CXR WNL.   4/26.  Patient complaining of neck pain, dizziness, poor appetite, abdominal discomfort, legs jumping especially at night.  Found to have low sodium.  Today sodium up to 127. 4/27.  Patient still complaining of neck pain and dizziness when moving her head.  Still having some jolting sensations in her legs and arms.  Patient orthostatic yesterday.  Given IV fluids.  Sodium a little lower today at 126.  Assessment and Plan: * Hyponatremia Weakness Fall Last sodium up to 126.  Changed diet to regular.  Discontinue Bactrim  since this may be contributing.  Encouraged to eat.  Discontinue fluids.  Start salt tablets.  Dizziness Twitching of her extremities and neck pain.  Will get MRI of the cervical spine.  Could be concussion from fall.  Did not have any room spinning.    Orthostatic hypotension Patient given fluid bolus yesterday and IV fluids overnight.  Not orthostatic this  morning.  Small intestinal bacterial overgrowth (SIBO) Changed over to Xifaxan .  Patient's daughter stating they are getting a prescription from [place].  Discontinued Bactrim .  Essential hypertension Holding blood pressure medications.  Patient orthostatic yesterday  Hypothyroidism Continue Synthroid   Anxiety and depression On Zoloft   Restless leg syndrome Possible restless leg syndrome.  Trial of Requip at night.  Not sure if twitching is secondary to cervical spine.  GERD (gastroesophageal reflux disease) PPI        Subjective: Patient still complaining of neck pain and choking sensation throughout her body.  Still with dizziness when moving her head.  Room not spinning.  Physical Exam: Vitals:   02/29/24 0810 02/29/24 2052 03/01/24 0308 03/01/24 0825  BP: 127/69 111/61 (!) 157/87 (!) 151/83  Pulse: 74 82 73 71  Resp: 17 16 20 18   Temp: 98.5 F (36.9 C) 98.1 F (36.7 C) 98 F (36.7 C) 97.7 F (36.5 C)  TempSrc: Oral Oral Oral   SpO2: 98% 99% 100% 98%  Weight:      Height:       Physical Exam HENT:     Head: Normocephalic.  Eyes:     General: Lids are normal.     Conjunctiva/sclera: Conjunctivae normal.  Cardiovascular:     Rate and Rhythm: Normal rate and regular rhythm.     Heart sounds: Normal heart sounds, S1 normal and S2 normal.  Pulmonary:     Breath sounds: Normal breath sounds. No decreased breath sounds, wheezing, rhonchi or rales.  Abdominal:     Palpations: Abdomen  is soft.     Tenderness: There is no abdominal tenderness.  Musculoskeletal:     Right lower leg: No swelling.     Left lower leg: No swelling.  Skin:    Comments: Bruising/scrapes face, nose  Neurological:     Mental Status: She is alert and oriented to person, place, and time.     Comments: Patient having involuntary twitching of her arms and legs.     Data Reviewed: Sodium 126, creatinine 0.82  Family Communication: Daughter at the bedside  Disposition: Status is:  Inpatient Remains inpatient appropriate because: Will MRI cervical spine  Planned Discharge Destination: Home with hospice    Time spent: 28 minutes  Author: Verla Glaze, MD 03/01/2024 12:43 PM  For on call review [email or web address].

## 2024-03-01 NOTE — Progress Notes (Signed)
 [facility] Hospitalized Patient   Ms. Saraann Flagler is a current hospice followed at home for terminal diagnosis of Abnormal Weight Loss.  Patient was brought to the ED via EMS after she fell this morning walking to the bathroom due to weakness.  She was admitted to [facility] on 4.25.25 with diagnosis of hyponatremia.  Per Dr. Mitcheal Amy, hospice MD, this is a related hospitalization.   Patient complains of continued neck pain.  Dr. Tyler Gallant ordered MRI today as patient with continued complains for several days and all x'rays were negative.  Saw patient and daughter in the hall as she was coming back from MRI. Patient with no plans for discharge today.  Remains appropriate for GIP LOC requiring continuous NS infusion for hyponatremia and around the clock skilled to monitor effectiveness of treatments.   Vital Signs:  T 98 oral, BP 157/87, P 73, R 20-  Oxi 100% on RA   I&O:  none reported   Abnormal labs:   Na 126, Cl 97, Ca 8.8, Anion Gap 4   Diagnostics:  MRI Cervical Spine without contrast today.  Results pending.   IV/PRN Meds:  Sodium Chloride  infusion @ 178ml/hour   Hospital Problem list: per progress note Dr. Verla Glaze 4.27.25   4/27. Patient still complaining of neck pain and dizziness when moving her head. Still having some jolting sensations in her legs and arms. Patient orthostatic yesterday. Given IV fluids. Sodium a little lower today at 126   Hyponatremia Weakness Fall Last sodium up to 126.  Changed diet to regular.  Discontinue Bactrim  since this may be contributing.  Encouraged to eat.  Discontinue fluids.  Start salt tablets.   Dizziness Twitching of her extremities and neck pain.  Will get MRI of the cervical spine.  Could be concussion from fall.  Did not have any room spinning.     Orthostatic hypotension Patient given fluid bolus yesterday and IV fluids overnight.  Not orthostatic this morning.   Small  intestinal bacterial overgrowth (SIBO) Changed over to Xifaxan .  Patient's daughter stating they are getting a prescription from [place].  Discontinued Bactrim .   Essential hypertension Holding blood pressure medications.  Patient orthostatic yesterday   Hypothyroidism Continue Synthroid    Anxiety and depression On Zoloft    Restless leg syndrome Possible restless leg syndrome.  Trial of Requip at night.  Not sure if twitching is secondary to cervical spine.   GERD (gastroesophageal reflux disease) PPI  Discharge Planning: ongoing.  No d/c today Family contact:  Vickie, daughter at bedside IDT:  Updated  Goals of Care-  Full Code   Sharrie Deed, [place], BSN, RN, [facility] [phone]

## 2024-03-01 NOTE — Plan of Care (Signed)
   Problem: Education: Goal: Knowledge of General Education information will improve Description: Including pain rating scale, medication(s)/side effects and non-pharmacologic comfort measures Outcome: Progressing   Problem: Health Behavior/Discharge Planning: Goal: Ability to manage health-related needs will improve Outcome: Progressing   Problem: Activity: Goal: Risk for activity intolerance will decrease Outcome: Progressing

## 2024-03-02 DIAGNOSIS — F32A Depression, unspecified: Secondary | ICD-10-CM

## 2024-03-02 DIAGNOSIS — I951 Orthostatic hypotension: Secondary | ICD-10-CM

## 2024-03-02 DIAGNOSIS — K219 Gastro-esophageal reflux disease without esophagitis: Secondary | ICD-10-CM

## 2024-03-02 DIAGNOSIS — R531 Weakness: Secondary | ICD-10-CM | POA: Diagnosis not present

## 2024-03-02 DIAGNOSIS — E871 Hypo-osmolality and hyponatremia: Secondary | ICD-10-CM | POA: Diagnosis not present

## 2024-03-02 DIAGNOSIS — R42 Dizziness and giddiness: Secondary | ICD-10-CM | POA: Diagnosis not present

## 2024-03-02 DIAGNOSIS — F419 Anxiety disorder, unspecified: Secondary | ICD-10-CM

## 2024-03-02 LAB — BASIC METABOLIC PANEL WITH GFR
Anion gap: 8 (ref 5–15)
BUN: 13 mg/dL (ref 8–23)
CO2: 27 mmol/L (ref 22–32)
Calcium: 9.7 mg/dL (ref 8.9–10.3)
Chloride: 96 mmol/L — ABNORMAL LOW (ref 98–111)
Creatinine, Ser: 0.73 mg/dL (ref 0.44–1.00)
GFR, Estimated: >60 mL/min (ref >60)
Glucose, Bld: 94 mg/dL (ref 70–99)
Potassium: 4.2 mmol/L (ref 3.5–5.1)
Sodium: 131 mmol/L — ABNORMAL LOW (ref 135–145)

## 2024-03-02 MED ORDER — SIMETHICONE 80 MG PO CHEW
80.0000 mg | CHEWABLE_TABLET | Freq: Three times a day (TID) | ORAL | Status: DC
  Administered 2024-03-02 – 2024-03-07 (×12): 80 mg via ORAL
  Filled 2024-03-02 (×14): qty 1

## 2024-03-02 MED ORDER — SODIUM CHLORIDE 1 G PO TABS
1.0000 g | ORAL_TABLET | Freq: Two times a day (BID) | ORAL | Status: DC
  Administered 2024-03-02 – 2024-03-03 (×2): 1 g via ORAL
  Filled 2024-03-02 (×2): qty 1

## 2024-03-02 MED ORDER — POLYETHYLENE GLYCOL 3350 17 G PO PACK
17.0000 g | PACK | Freq: Every day | ORAL | Status: DC
  Administered 2024-03-02 – 2024-03-03 (×2): 17 g via ORAL
  Filled 2024-03-02 (×2): qty 1

## 2024-03-02 MED ORDER — METHYLPREDNISOLONE SODIUM SUCC 40 MG IJ SOLR
40.0000 mg | Freq: Once | INTRAMUSCULAR | Status: AC
Start: 2024-03-02 — End: 2024-03-02
  Administered 2024-03-02: 40 mg via INTRAVENOUS
  Filled 2024-03-02: qty 1

## 2024-03-02 MED ORDER — SIMETHICONE 80 MG PO CHEW: 80.0000 mg | CHEWABLE_TABLET | Freq: Three times a day (TID) | ORAL | Status: DC

## 2024-03-02 NOTE — Progress Notes (Signed)
 Progress Note   Patient: Bonnie Barker:[id number] DOB: 08-24-1929 DOA: 02/28/2024     2 DOS: the patient was seen and examined on 03/02/2024   Brief hospital course: 89 y.o. female with medical history significant of non-obstructive CAD, SIBO on Bactrim , HTN, HLD, hypothyroidism presenting with weakness, fall, hyponatremia.  Patient reports falling attempted going to bathroom today.  No reported LOC.  Positive head abrasion.  Noted to have been admitted April 7 through 8 4 8  for similar issues including near syncope as well as AKI.  Family reports overall decreased p.o. intake.  Has had recurrent bowel movements associated with SIBO.  No reported recent medication changes from discharge.  No fevers or chills.  Positive overall weakness.  Was also discharged with hospice.  Family still in process of working with hospice. Presented to the ER afebrile, hemodynamically stable.  Satting well on room air.  White count 5.5, hemoglobin 14.1, platelets 217.  Creatinine 0.83.  Sodium 123. CT head, CT C spine and CXR WNL.   4/26.  Patient complaining of neck pain, dizziness, poor appetite, abdominal discomfort, legs jumping especially at night.  Found to have low sodium.  Today sodium up to 127. 4/27.  Patient still complaining of neck pain and dizziness when moving her head.  Still having some jolting sensations in her legs and arms.  Patient orthostatic yesterday.  Given IV fluids.  Sodium a little lower today at 126.  Salt tablets 3 times daily prescribed 4/28.  Patient still having involuntary jerking of her arms and legs.  Reviewed MRI of the cervical spine showing degeneration with mild multifactorial spinal canal stenosis at C2-C3, C3-C4, C5-C6 and mild associated cord mass effect but no signal abnormality, multilevel moderate degenerative neural foraminal stenosis up to severe at left C6 nerve level.  Trial of Solu-Medrol.  Sodium 131 we will decrease salt tablets to twice daily.  Assessment and  Plan: * Hyponatremia Weakness Fall Last sodium up to 131.  Continue regular diet.  Discontinue Bactrim  since this may be contributing.  Encouraged to eat.  Discontinued fluids.  Decrease salt tablets to twice daily dosing.  Dizziness Twitching of her extremities and neck pain.  MRI cervical spine shows degenerative changes with mild multifactorial spinal stenosis at multiple levels with mild associated cord mass effect but no signal abnormality.  Multilevel moderate degenerative neural foraminal stenosis most severe at the left C6 nerve level.  Trial of Solu-Medrol.  May end up needing low-dose benzodiazepine.  Orthostatic hypotension Orthostatic 2 days ago.  Small intestinal bacterial overgrowth (SIBO) Changed over to Xifaxan .  Patient's daughter stating they are getting a prescription from [place].  Discontinued Bactrim .  Essential hypertension Holding blood pressure medications.  Patient orthostatic on the other day  Hypothyroidism Continue Synthroid   Anxiety and depression On Zoloft   Restless leg syndrome Discontinue Requip since it did not help.  GERD (gastroesophageal reflux disease) PPI        Subjective: Patient still having some neck pain and dizziness and twitching uncontrollably of her arms and legs.  Patient goes off on tangents very easily.  Initially came in with fall and found to be hyponatremic.  Physical Exam: Vitals:   03/01/24 0308 03/01/24 0825 03/01/24 1923 03/02/24 0901  BP: (!) 157/87 (!) 151/83 (!) 166/86 (!) 175/84  Pulse: 73 71 73 81  Resp: 20 18 16 18   Temp: 98 F (36.7 C) 97.7 F (36.5 C) 98 F (36.7 C) 98.6 F (37 C)  TempSrc: Oral  Oral Oral  SpO2: 100% 98% 99% 98%  Weight:      Height:       Physical Exam HENT:     Head: Normocephalic.  Eyes:     General: Lids are normal.     Conjunctiva/sclera: Conjunctivae normal.  Cardiovascular:     Rate and Rhythm: Normal rate and regular rhythm.     Heart sounds: Normal heart sounds, S1  normal and S2 normal.  Pulmonary:     Breath sounds: Normal breath sounds. No decreased breath sounds, wheezing, rhonchi or rales.  Abdominal:     Palpations: Abdomen is soft.     Tenderness: There is no abdominal tenderness.  Musculoskeletal:     Right lower leg: No swelling.     Left lower leg: No swelling.  Skin:    Comments: Bruising/scrapes face, nose  Neurological:     Mental Status: She is alert and oriented to person, place, and time.     Comments: Patient having involuntary twitching of her arms and legs.     Data Reviewed: MRI reviewed Sodium 131, potassium 4.2, creatinine 0.73  Family Communication: Spoke with daughter at the bedside  Disposition: Status is: Inpatient Remains inpatient appropriate because: Trial of Solu-Medrol for neck pain.  May end up needing a low-dose benzodiazepine for the twitching.  Planned Discharge Destination: Home with hospice    Time spent: 28 minutes  Author: Verla Glaze, MD 03/02/2024 11:59 AM  For on call review [email or web address].

## 2024-03-02 NOTE — Plan of Care (Signed)

## 2024-03-02 NOTE — Progress Notes (Signed)
 [facility] Hospitalized Patient   [name] is a current hospice followed at home for terminal diagnosis of Abnormal Weight Loss.  Patient was brought to the ED via EMS after she fell this morning walking to the bathroom due to weakness.  She was admitted to [facility] on 4.25.25 with diagnosis of hyponatremia.  Per Dr. Mitcheal Amy, hospice MD, this is a related hospitalization.   Patient sitting up in bed eating lunch.  Daughter and son at the bedside.  No needs or concerns were brought up by patient or family.     Remains appropriate for GIP LOC requiring continuous NS infusion for hyponatremia and around the clock skilled to monitor effectiveness of treatments.   Vital Signs:  T 98.6l, BP 175/84, P 81, R 18-  Oxi 98% on RA   I&O:  120/not recorded   Abnormal labs:   Sodium 131, Chloride 96   Diagnostics:  MRI Cervical Spine without contrast today.     IMPRESSION: 1. Cervical spine degeneration with mild multifactorial spinal stenosis, C2-C3, C3-C4, C5-C6. Up to mild associated cord mass effect, but no cord signal abnormality. 2. Multilevel moderate degenerative neural foraminal stenosis, up to severe at the left C6 nerve level.     Electronically Signed   By: Marlise Simpers M.D.   On: 03/01/2024 17:23      IV/PRN Meds:  Solu-Medrol 40mg  IV once today.     Hospital Problem list: per progress note Dr. Verla Glaze 4.28.25  Assessment and Plan: * Hyponatremia Weakness Fall Last sodium up to 131.  Continue regular diet.  Discontinue Bactrim  since this may be contributing.  Encouraged to eat.  Discontinued fluids.  Decrease salt tablets to twice daily dosing.   Dizziness Twitching of her extremities and neck pain.  MRI cervical spine shows degenerative changes with mild multifactorial spinal stenosis at multiple levels with mild associated cord mass effect but no signal abnormality.  Multilevel moderate degenerative neural  foraminal stenosis most severe at the left C6 nerve level.  Trial of Solu-Medrol.  May end up needing low-dose benzodiazepine.   Orthostatic hypotension Orthostatic 2 days ago.   Small intestinal bacterial overgrowth (SIBO) Changed over to Xifaxan .  Patient's daughter stating they are getting a prescription from [place].  Discontinued Bactrim .   Essential hypertension Holding blood pressure medications.  Patient orthostatic on the other day   Hypothyroidism Continue Synthroid    Anxiety and depression On Zoloft    Restless leg syndrome Discontinue Requip since it did not help.   GERD (gastroesophageal reflux disease) PPI    Discharge Planning: ongoing.  No d/c today Family contact: Daughter and son at the bedside.   IDT:  Updated  Goals of Care-  Full Code  [facility] Liaison 336 [phone]

## 2024-03-02 NOTE — TOC Progression Note (Signed)
 Transition of Care [facility]) - Progression Note    Patient Details  Name: Bonnie Barker MRN: [id number] Date of Birth: October 16, 1929  [facility] CM/SW Contact  Loman Risk, RN Phone Number: 03/02/2024, 2:31 PM  Clinical Narrative:     Therapy recommending home health Met with patient, daughter and son at bedside Patient confirms at this time plan is to return home with home hospice services through [employer]   Expected Discharge Plan: Home w Hospice Care Barriers to Discharge: Continued Medical Work up  Expected Discharge Plan and Services     Post Acute Care Choice: Resumption of Svcs/PTA Provider Living arrangements for the past 2 months: Single Family Home                                       Social Determinants of Health (SDOH) Interventions SDOH Screenings   Food Insecurity: No Food Insecurity (02/28/2024)  Housing: Low Risk  (02/28/2024)  Transportation Needs: No Transportation Needs (02/28/2024)  Utilities: Not At Risk (02/28/2024)  Financial Resource Strain: Patient Declined (08/15/2023)   Received from [facility]  Social Connections: Socially Isolated (02/28/2024)  Tobacco Use: Low Risk  (02/28/2024)    Readmission Risk Interventions     No data to display

## 2024-03-02 NOTE — Plan of Care (Signed)
  Problem: Pain Managment: Goal: General experience of comfort will improve and/or be controlled Outcome: Progressing   Problem: Safety: Goal: Ability to remain free from injury will improve Outcome: Progressing   Problem: Skin Integrity: Goal: Risk for impaired skin integrity will decrease Outcome: Progressing

## 2024-03-03 DIAGNOSIS — R253 Fasciculation: Secondary | ICD-10-CM

## 2024-03-03 DIAGNOSIS — R531 Weakness: Secondary | ICD-10-CM | POA: Diagnosis not present

## 2024-03-03 DIAGNOSIS — R42 Dizziness and giddiness: Secondary | ICD-10-CM | POA: Diagnosis not present

## 2024-03-03 DIAGNOSIS — K5900 Constipation, unspecified: Secondary | ICD-10-CM | POA: Insufficient documentation

## 2024-03-03 DIAGNOSIS — E871 Hypo-osmolality and hyponatremia: Secondary | ICD-10-CM | POA: Diagnosis not present

## 2024-03-03 LAB — BASIC METABOLIC PANEL WITH GFR
Anion gap: 4 — ABNORMAL LOW (ref 5–15)
BUN: 20 mg/dL (ref 8–23)
CO2: 27 mmol/L (ref 22–32)
Calcium: 9.6 mg/dL (ref 8.9–10.3)
Chloride: 96 mmol/L — ABNORMAL LOW (ref 98–111)
Creatinine, Ser: 0.83 mg/dL (ref 0.44–1.00)
GFR, Estimated: >60 mL/min (ref >60)
Glucose, Bld: 88 mg/dL (ref 70–99)
Potassium: 3.8 mmol/L (ref 3.5–5.1)
Sodium: 127 mmol/L — ABNORMAL LOW (ref 135–145)

## 2024-03-03 MED ORDER — POLYETHYLENE GLYCOL 3350 17 G PO PACK
17.0000 g | PACK | Freq: Two times a day (BID) | ORAL | Status: DC
  Administered 2024-03-03 – 2024-03-06 (×6): 17 g via ORAL
  Filled 2024-03-03 (×7): qty 1

## 2024-03-03 MED ORDER — LACTULOSE 10 GM/15ML PO SOLN
20.0000 g | Freq: Once | ORAL | Status: AC
  Administered 2024-03-03: 20 g via ORAL
  Filled 2024-03-03: qty 30

## 2024-03-03 MED ORDER — SODIUM CHLORIDE 1 G PO TABS
1.0000 g | ORAL_TABLET | Freq: Three times a day (TID) | ORAL | Status: DC
  Administered 2024-03-03: 1 g via ORAL
  Filled 2024-03-03 (×2): qty 1

## 2024-03-03 MED ORDER — LORAZEPAM 0.5 MG PO TABS
0.5000 mg | ORAL_TABLET | Freq: Two times a day (BID) | ORAL | Status: DC
  Administered 2024-03-03 – 2024-03-07 (×9): 0.5 mg via ORAL
  Filled 2024-03-03 (×9): qty 1

## 2024-03-03 MED ORDER — PREDNISONE 20 MG PO TABS
20.0000 mg | ORAL_TABLET | Freq: Every day | ORAL | Status: DC
  Administered 2024-03-04 – 2024-03-07 (×4): 20 mg via ORAL
  Filled 2024-03-03 (×4): qty 1

## 2024-03-03 NOTE — Assessment & Plan Note (Signed)
 Did not improve initially with a trial of Requip.  Will try Ativan  and see if that helps.  On reevaluation this afternoon she said it helped a little bit.  Would likely continue this medication upon discharge.

## 2024-03-03 NOTE — Progress Notes (Signed)
 [facility] rm 205 [facility] Hospitalized Patient   Bonnie Barker is a current hospice followed at home for terminal diagnosis of Abnormal Weight Loss.  Patient was brought to the ED via EMS after she fell this morning walking to the bathroom due to weakness.  She was admitted to [facility] on 4.25.25 with diagnosis of hyponatremia.  Per Dr. Mitcheal Amy, hospice MD, this is a related hospitalization.   Visited at bedside with patient and her son and daughter. Patient reports to having a more restful night and feels like she is improving some. She reports that her neck pain is improving but her leg restlessness is not getting any better. PT at bedside for assessment.    Patient is inpatient appropriate as they trial IV steroid for treatment of muscle spasms.   Vital Signs: 97.9/72/16     124/61     spO2 99% room air   I&O: not recorded   Abnormal labs: Na+ 127   Diagnostics: None new    IV/PRN Meds: Solu-Medrol 40mg  IV once   Hospital Problem list: per progress note Dr. Verla Glaze 4.29.25   [facility] Hospitalized Patient   [name] is a current hospice followed at home for terminal diagnosis of Abnormal Weight Loss.  Patient was brought to the ED via EMS after she fell this morning walking to the bathroom due to weakness.  She was admitted to [facility] on 4.25.25 with diagnosis of hyponatremia.  Per Dr. Mitcheal Amy, hospice MD, this is a related hospitalization.   Patient sitting up in bed eating lunch.  Daughter and son at the bedside.  No needs or concerns were brought up by patient or family.     Remains appropriate for GIP LOC requiring continuous NS infusion for hyponatremia and around the clock skilled to monitor effectiveness of treatments.   Vital Signs:  T 98.6l, BP 175/84, P 81, R 18-  Oxi 98% on RA   I&O:  120/not recorded   Abnormal labs:   Sodium 131, Chloride 96    Diagnostics:  MRI Cervical Spine without contrast today.     IMPRESSION: 1. Cervical spine degeneration with mild multifactorial spinal stenosis, C2-C3, C3-C4, C5-C6. Up to mild associated cord mass effect, but no cord signal abnormality. 2. Multilevel moderate degenerative neural foraminal stenosis, up to severe at the left C6 nerve level.     Electronically Signed   By: Marlise Simpers M.D.   On: 03/01/2024 17:23       IV/PRN Meds:  Solu-Medrol 40mg  IV once today.     Hospital Problem list: per progress note Dr. Verla Glaze 4.28.25   Assessment and Plan: * Hyponatremia Weakness Fall Last sodium up to 131.  Continue regular diet.  Discontinue Bactrim  since this may be contributing.  Encouraged to eat.  Discontinued fluids.  Decrease salt tablets to twice daily dosing.   Dizziness Twitching of her extremities and neck pain.  MRI cervical spine shows degenerative changes with mild multifactorial spinal stenosis at multiple levels with mild associated cord mass effect but no signal abnormality.  Multilevel moderate degenerative neural foraminal stenosis most severe at the left C6 nerve level.  Trial of Solu-Medrol.  May end up needing low-dose benzodiazepine.   Orthostatic hypotension Orthostatic 2 days ago.   Small intestinal bacterial overgrowth (SIBO) Changed over to Xifaxan .  Patient's daughter stating they are getting a prescription from [place].  Discontinued Bactrim .   Essential hypertension Holding blood  pressure medications.  Patient orthostatic on the other day   Hypothyroidism Continue Synthroid    Anxiety and depression On Zoloft    Restless leg syndrome Discontinue Requip since it did not help.   GERD (gastroesophageal reflux disease) PPI   Discharge Planning: ongoing.  No d/c today Family contact: Daughter and son at the bedside.   IDT:  Updated  Goals of Care-  Full Code  Please don't hesitate to reach out for any hospice related questions or  concerns Dwane Gitelman, BSN, RN Hospice hospital liaison [phone]

## 2024-03-03 NOTE — Assessment & Plan Note (Signed)
 No bowel movement yet.  Will give another dose of lactulose and make MiraLAX  twice daily

## 2024-03-03 NOTE — Progress Notes (Signed)
 Progress Note   Patient: Bonnie Barker:[id number] DOB: 1929-05-23 DOA: 02/28/2024     3 DOS: the patient was seen and examined on 03/03/2024   Brief hospital course: 89 y.o. female with medical history significant of non-obstructive CAD, SIBO on Bactrim , HTN, HLD, hypothyroidism presenting with weakness, fall, hyponatremia.  Patient reports falling attempted going to bathroom today.  No reported LOC.  Positive head abrasion.  Noted to have been admitted April 7 through 8 4 8  for similar issues including near syncope as well as AKI.  Family reports overall decreased p.o. intake.  Has had recurrent bowel movements associated with SIBO.  No reported recent medication changes from discharge.  No fevers or chills.  Positive overall weakness.  Was also discharged with hospice.  Family still in process of working with hospice. Presented to the ER afebrile, hemodynamically stable.  Satting well on room air.  White count 5.5, hemoglobin 14.1, platelets 217.  Creatinine 0.83.  Sodium 123. CT head, CT C spine and CXR WNL.   4/26.  Patient complaining of neck pain, dizziness, poor appetite, abdominal discomfort, legs jumping especially at night.  Found to have low sodium.  Today sodium up to 127. 4/27.  Patient still complaining of neck pain and dizziness when moving her head.  Still having some jolting sensations in her legs and arms.  Patient orthostatic yesterday.  Given IV fluids.  Sodium a little lower today at 126.  Salt tablets 3 times daily prescribed 4/28.  Patient still having involuntary jerking of her arms and legs.  Reviewed MRI of the cervical spine showing degeneration with mild multifactorial spinal canal stenosis at C2-C3, C3-C4, C5-C6 and mild associated cord mass effect but no signal abnormality, multilevel moderate degenerative neural foraminal stenosis up to severe at left C6 nerve level.  Trial of Solu-Medrol.  Sodium 131 we will decrease salt tablets to twice daily. 4/29.  For the  patient's involuntary twitching will give a trial of Ativan .  In the afternoon she says her twitching is a little bit less but still doing it.  Sodium down to 127 will increase salt tablets to 3 times daily dosing.  Patient says her neck pain is a little bit better after IV Solu-Medrol.  Assessment and Plan: * Muscle twitching Did not improve initially with a trial of Requip.  Will try Ativan  and see if that helps.  On reevaluation this afternoon she said it helped a little bit.  Would likely continue this medication upon discharge.  Hyponatremia Weakness Fall Sodium 127.  Increase salt tablets to 3 times daily dosing.  Dizziness Muscle twitching also.  Patient was orthostatic initially.  MRI cervical spine shows degenerative changes with mild multifactorial spinal stenosis at multiple levels with mild associated cord mass effect but no signal abnormality.  Multilevel moderate degenerative neural foraminal stenosis most severe at the left C6 nerve level.  Trial of Ativan  for twitching.  Will switch Solu-Medrol over to prednisone taper for her neck pain since she said her neck pain was better after Solu-Medrol.  Orthostatic hypotension Holding off blood pressure medication secondary to orthostatic hypotension would rather have her blood pressure on the higher side.  Small intestinal bacterial overgrowth (SIBO) Changed over to Xifaxan .  Patient's daughter stating they are getting a prescription from [place].  Discontinued Bactrim  because this is likely contributing to her symptoms and low sodium  Essential hypertension Holding blood pressure medications.  Patient orthostatic on the other day  Hypothyroidism Continue Synthroid   Constipation No bowel  movement yet.  Will give another dose of lactulose and make MiraLAX  twice daily  Anxiety and depression On Zoloft , added Ativan .  GERD (gastroesophageal reflux disease) PPI        Subjective: Patient seen this morning and started on  Ativan  for her twitching.  Patient is neck feels a little bit better wants to continue steroid taper.  Sodium lower today at 127.  Admitted with hyponatremia and weakness.  Physical Exam: Vitals:   03/03/24 0335 03/03/24 0602 03/03/24 0727 03/03/24 1436  BP: (!) 182/95 (!) 172/80 (!) 161/88 124/61  Pulse: 84  73 72  Resp: 20  16 16   Temp: 98.2 F (36.8 C)  98 F (36.7 C) 97.9 F (36.6 C)  TempSrc: Oral     SpO2: 99%  100% 99%  Weight:      Height:       Physical Exam HENT:     Head: Normocephalic.  Eyes:     General: Lids are normal.     Conjunctiva/sclera: Conjunctivae normal.  Cardiovascular:     Rate and Rhythm: Normal rate and regular rhythm.     Heart sounds: Normal heart sounds, S1 normal and S2 normal.  Pulmonary:     Breath sounds: Normal breath sounds. No decreased breath sounds, wheezing, rhonchi or rales.  Abdominal:     Palpations: Abdomen is soft.     Tenderness: There is no abdominal tenderness.  Musculoskeletal:     Right lower leg: No swelling.     Left lower leg: No swelling.  Skin:    Comments: Bruising/scrapes face, nose  Neurological:     Mental Status: She is alert and oriented to person, place, and time.     Comments: Patient having involuntary twitching of her arms and legs.     Data Reviewed: Sodium 127, creatinine 0.83  Family Communication: Spoke with daughter at the bedside this morning, spoke with children at the bedside this afternoon  Disposition: Status is: Inpatient Remains inpatient appropriate because: Trial of Ativan  for her uncontrollable twitching.  Increase salt tablets to 3 times daily dosing  Planned Discharge Destination: Home with hospice    Time spent: 28 minutes  Author: Verla Glaze, MD 03/03/2024 3:50 PM  For on call review [email or web address].

## 2024-03-03 NOTE — Progress Notes (Signed)
 Physical Therapy Treatment Patient Details Name: Bonnie Barker MRN: [id number] DOB: [birth year] Today's Date: 03/03/2024   History of Present Illness Bonnie Barker is a 89 y.o. female with medical history significant of non-obstructive CAD, SIBO on Bactrim , HTN, HLD, hypothyroidism presenting with weakness, fall, hyponatremia.    PT Comments  Patient alert, oriented to self, family, situation, reported ongoing neck/head pain throughout session. Pt BP assessed with each change of position, pt experiencing shaking and dizziness as well. BP readings: 180/93, sitting 162.94, and standing 133/83, MD notified. Pt required modA for all bed mobility, sit <> stand with modA and constant min-modA due to posterior lean. Able ambulate ~67ft total, did express some fatigue with mobility. Pt in bed with needs in reach. The patient would benefit from further skilled PT intervention to continue to progress towards goals.     If plan is discharge home, recommend the following: A little help with walking and/or transfers;A little help with bathing/dressing/bathroom;Assistance with cooking/housework;Help with stairs or ramp for entrance;Assist for transportation   Can travel by private vehicle        Equipment Recommendations  None recommended by PT    Recommendations for Other Services       Precautions / Restrictions Precautions Precautions: Fall Restrictions Weight Bearing Restrictions Per Provider Order: No     Mobility  Bed Mobility Overal bed mobility: Needs Assistance Bed Mobility: Supine to Sit, Sit to Supine     Supine to sit: Mod assist Sit to supine: Mod assist        Transfers   Equipment used: 1 person hand held assist Transfers: Sit to/from Stand Sit to Stand: Mod assist                Ambulation/Gait Ambulation/Gait assistance: Mod assist, Min assist Gait Distance (Feet): 20 Feet Assistive device: 1 person hand held assist   Gait velocity: decreased      General Gait Details: able to step forwards/backwards several times, and after a seated rest break able to ambulate around the bed min-modA   Stairs             Wheelchair Mobility     Tilt Bed    Modified Rankin (Stroke Patients Only)       Balance Overall balance assessment: Needs assistance Sitting-balance support: No upper extremity supported, Feet supported Sitting balance-Leahy Scale: Fair Sitting balance - Comments: progressed to sitting statically over time   Standing balance support: Bilateral upper extremity supported Standing balance-Leahy Scale: Poor Standing balance comment: min-modA to correct posterior lean throughout                            Communication    Cognition Arousal: Alert Behavior During Therapy: WFL for tasks assessed/performed   PT - Cognitive impairments: No apparent impairments                                Cueing    Exercises      General Comments        Pertinent Vitals/Pain Pain Assessment Pain Assessment: Faces Faces Pain Scale: Hurts even more Pain Location: back of head/neck; especially when flexing neck Pain Descriptors / Indicators: Pounding, Aching, Sore Pain Intervention(s): Limited activity within patient's tolerance, Monitored during session, Repositioned    Home Living  Prior Function            PT Goals (current goals can now be found in the care plan section) Progress towards PT goals: Progressing toward goals    Frequency    Min 2X/week      PT Plan      Co-evaluation              AM-PAC PT "6 Clicks" Mobility   Outcome Measure  Help needed turning from your back to your side while in a flat bed without using bedrails?: A Little Help needed moving from lying on your back to sitting on the side of a flat bed without using bedrails?: A Little Help needed moving to and from a bed to a chair (including a wheelchair)?: A  Little Help needed standing up from a chair using your arms (e.g., wheelchair or bedside chair)?: A Lot Help needed to walk in hospital room?: A Lot Help needed climbing 3-5 steps with a railing? : A Lot 6 Click Score: 15    End of Session Equipment Utilized During Treatment: Gait belt Activity Tolerance: Patient tolerated treatment well Patient left: with call bell/phone within reach;with family/visitor present;in bed;with bed alarm set Nurse Communication: Mobility status PT Visit Diagnosis: Unsteadiness on feet (R26.81);Muscle weakness (generalized) (M62.81)     Time: 5284-1324 PT Time Calculation (min) (ACUTE ONLY): 23 min  Charges:    $Therapeutic Activity: 23-37 mins PT General Charges $$ ACUTE PT VISIT: 1 Visit                    Darien Eden PT, DPT 12:44 PM,03/03/24

## 2024-03-04 DIAGNOSIS — R253 Fasciculation: Secondary | ICD-10-CM | POA: Diagnosis not present

## 2024-03-04 LAB — CBC
HCT: 38.4 % (ref 36.0–46.0)
Hemoglobin: 12.7 g/dL (ref 12.0–15.0)
MCH: 29.9 pg (ref 26.0–34.0)
MCHC: 33.1 g/dL (ref 30.0–36.0)
MCV: 90.4 fL (ref 80.0–100.0)
Platelets: 252 10*3/uL (ref 150–400)
RBC: 4.25 MIL/uL (ref 3.87–5.11)
RDW: 12.5 % (ref 11.5–15.5)
WBC: 6.6 10*3/uL (ref 4.0–10.5)
nRBC: 0 % (ref 0.0–0.2)

## 2024-03-04 LAB — BASIC METABOLIC PANEL WITH GFR
Anion gap: 10 (ref 5–15)
BUN: 20 mg/dL (ref 8–23)
CO2: 25 mmol/L (ref 22–32)
Calcium: 10.1 mg/dL (ref 8.9–10.3)
Chloride: 99 mmol/L (ref 98–111)
Creatinine, Ser: 0.54 mg/dL (ref 0.44–1.00)
GFR, Estimated: >60 mL/min (ref >60)
Glucose, Bld: 79 mg/dL (ref 70–99)
Potassium: 3.9 mmol/L (ref 3.5–5.1)
Sodium: 125 mmol/L — ABNORMAL LOW (ref 135–145)

## 2024-03-04 LAB — OSMOLALITY, URINE: Osmolality, Ur: 568 mosm/kg (ref 300–900)

## 2024-03-04 LAB — SODIUM: Sodium: 134 mmol/L — ABNORMAL LOW (ref 135–145)

## 2024-03-04 LAB — OSMOLALITY: Osmolality: 274 mosm/kg — ABNORMAL LOW (ref 275–295)

## 2024-03-04 LAB — AMMONIA: Ammonia: <13 umol/L (ref 9–35)

## 2024-03-04 LAB — SODIUM, URINE, RANDOM: Sodium, Ur: 56 mmol/L

## 2024-03-04 MED ORDER — SENNOSIDES-DOCUSATE SODIUM 8.6-50 MG PO TABS
1.0000 | ORAL_TABLET | Freq: Two times a day (BID) | ORAL | Status: DC
  Administered 2024-03-04 – 2024-03-06 (×6): 1 via ORAL
  Filled 2024-03-04 (×6): qty 1

## 2024-03-04 MED ORDER — FLEET ENEMA RE ENEM
1.0000 | ENEMA | Freq: Every day | RECTAL | Status: DC | PRN
  Administered 2024-03-04: 1 via RECTAL

## 2024-03-04 MED ORDER — SODIUM CHLORIDE 0.9 % IV BOLUS
500.0000 mL | Freq: Once | INTRAVENOUS | Status: AC
  Administered 2024-03-04: 500 mL via INTRAVENOUS

## 2024-03-04 MED ORDER — BISACODYL 10 MG RE SUPP
10.0000 mg | Freq: Every day | RECTAL | Status: DC | PRN
  Filled 2024-03-04: qty 1

## 2024-03-04 NOTE — Progress Notes (Addendum)
 [facility] rm 205 [facility] Hospitalized Patient   [name] is a current hospice followed at home for terminal diagnosis of Abnormal Weight Loss.  Patient was brought to the ED via EMS after she fell this morning walking to the bathroom due to weakness.  She was admitted to [facility] on 4.25.25 with diagnosis of hyponatremia.  Per Dr. Mitcheal Barker, hospice MD, this is a related hospitalization.   Went to patient's room, and all of the lights were out.  Patient and daughter were asleep.     Patient is inpatient appropriate as MD reports they need to work on getting sodium level up.     Vital Signs: 98.1, 143/79, 74,18    spO2 97% room air   I&O: 480/not recorded   Abnormal labs: Sodium 125, Osmolality 274   Diagnostics: None new    IV/PRN Meds: none   Hospital Problem list: per progress note Bonnie Barker 4.30.25 Assessment and Plan:   * Muscle twitching / myoclonic jerks Cervical myelopathy ruled out with MRI C-spine, while there is some mild cord compression, the cord signal with intact with abnormality.   Prior labs noted: iron studies, TSH, B12, vitamin D  -- all normal 02/10/24 --Continue trial low dose Ativan  - pt and daughter report helpful, but discussed side effects, increased fall risk, sedation.  Monitor closely and increase fall precautions. --Mgmt of neck pain as outlined --Check ammonia level     Hyponatremia - Na 123 on admission 4/25 (down from 130 on 02/11/24). Patient was started on IV fluids in the ED, appears sodium improved to 128. Then started on salt tablets.   Sodium improved to peak of 131 >> 127 >> 125 today 4/30 -- clinically pt appears hypovolemic, limited PO intake reported --Stop salt tablets --Fluid challenge: 500 cc bolus NS this AM --Repeat sodium level this afternoon --Add on serum osm, urine osm and urine sodium --Check AM cortisol tomorrow --Daily weights to follow volume status     Acute  metabolic encephalopathy - with hallucinations, likely due to hyponatremia and hospital environment (delirium) --Mgmt of hyponatremia as outlined --Delirium precautions   Generalized Weakness / Fall - likely due to hyponatremia --PT/OT recommending SNF --Fall precautions   Dizziness - suspect hyponatremia, initially was also orthostatic. Orthostatic hypotension Neck Pain - chronic, degenerative joint disease MRI cervical spine shows degenerative changes with mild multifactorial spinal stenosis at multiple levels with mild associated cord mass effect but no signal abnormality.  Multilevel moderate degenerative neural foraminal stenosis most severe at the left C6 nerve level.  --Trial of Ativan  for twitching.   --Empiric course of steroids: IV Solu-Medrol >> prednisone taper for her neck pain  --Repeat orthostatic vitals daily --Holding BP meds, will allow some hypertension to offset orthostatic BP drop --Fall precautions   Small intestinal bacterial overgrowth (SIBO) --Changed Bactrim  >> Xifaxan .   Patient's daughter stating they are getting a prescription from [place], so will have this Rx at time of discharge.   --Discontinued Bactrim  because ?contributing to her symptoms and low sodium --Follow up with GI outpatient   Essential hypertension --Holding blood pressure medications due to orthostatic and dizziness   Hypothyroidism - TSH was normal early April --Continue Synthroid    Constipation No bowel movement yet since admission --Scheduled Miralax  BID --Add Senna-S BID --PRN suppository or enema   Anxiety and depression --On Zoloft , added Ativan .   GERD (gastroesophageal reflux disease) --PPI     Discharge Planning: ongoing.  No d/c today.  MD is hoping patient will be ready for dc Friday.   Family contact: Daughter at the bedside.   IDT:  Updated  Goals of Care-  Full Code   Please don't hesitate to reach out for any hospice related questions or concerns  [facility] Liaison 336 [phone]

## 2024-03-04 NOTE — Plan of Care (Signed)

## 2024-03-04 NOTE — Progress Notes (Addendum)
 Progress Note   Patient: Bonnie Barker NWG:[id number] DOB: 1929-01-25 DOA: 02/28/2024     4 DOS: the patient was seen and examined on 03/04/2024   Brief hospital course: "89 y.o. female with medical history significant of non-obstructive CAD, SIBO on Bactrim , HTN, HLD, hypothyroidism presenting with weakness, fall, hyponatremia.  Patient reports falling attempted going to bathroom today.  No reported LOC.  Positive head abrasion.  Noted to have been admitted April 7 through 8 4 8  for similar issues including near syncope as well as AKI.  Family reports overall decreased p.o. intake.  Has had recurrent bowel movements associated with SIBO.  No reported recent medication changes from discharge.  No fevers or chills.  Positive overall weakness.  Was also discharged with hospice.  Family still in process of working with hospice. Presented to the ER afebrile, hemodynamically stable.  Satting well on room air.  White count 5.5, hemoglobin 14.1, platelets 217.  Creatinine 0.83.  Sodium 123. CT head, CT C spine and CXR WNL.    4/26.  Patient complaining of neck pain, dizziness, poor appetite, abdominal discomfort, legs jumping especially at night.  Found to have low sodium.  Today sodium up to 127. 4/27.  Patient still complaining of neck pain and dizziness when moving her head.  Still having some jolting sensations in her legs and arms.  Patient orthostatic yesterday.  Given IV fluids.  Sodium a little lower today at 126.  Salt tablets 3 times daily prescribed 4/28.  Patient still having involuntary jerking of her arms and legs.  Reviewed MRI of the cervical spine showing degeneration with mild multifactorial spinal canal stenosis at C2-C3, C3-C4, C5-C6 and mild associated cord mass effect but no signal abnormality, multilevel moderate degenerative neural foraminal stenosis up to severe at left C6 nerve level.  Trial of Solu-Medrol.  Sodium 131 we will decrease salt tablets to twice daily. 4/29.  For the  patient's involuntary twitching will give a trial of Ativan .  In the afternoon she says her twitching is a little bit less but still doing it.  Sodium down to 127 will increase salt tablets to 3 times daily dosing.  Patient says her neck pain is a little bit better after IV Solu-Medrol."   I assumed care on 03/04/24.  4/30 -- sodium down from peak of 131 >> 127 >> 125 again this AM.  Pt severely constipated.  Muscle twitching/jerking continue but seem little better with Ativan .  Steroids helping neck pain.   Assessment and Plan:  * Muscle twitching / myoclonic jerks Cervical myelopathy ruled out with MRI C-spine, while there is some mild cord compression, the cord signal with intact with abnormality.   Prior labs noted: iron studies, TSH, B12, vitamin D  -- all normal 02/10/24 --Continue trial low dose Ativan  - pt and daughter report helpful, but discussed side effects, increased fall risk, sedation.  Monitor closely and increase fall precautions. --Mgmt of neck pain as outlined --Check ammonia level    Hyponatremia - Na 123 on admission 4/25 (down from 130 on 02/11/24). Patient was started on IV fluids in the ED, appears sodium improved to 128. Then started on salt tablets.   Sodium improved to peak of 131 >> 127 >> 125 today 4/30 -- clinically pt appears hypovolemic, limited PO intake reported --Stop salt tablets --Fluid challenge: 500 cc bolus NS this AM --Repeat sodium level this afternoon --Add on serum osm, urine osm and urine sodium --Check AM cortisol tomorrow --Daily weights to follow volume  status   Acute metabolic encephalopathy - with hallucinations, likely due to hyponatremia and hospital environment (delirium) --Mgmt of hyponatremia as outlined --Delirium precautions  Generalized Weakness / Fall - likely due to hyponatremia --PT/OT recommending SNF --Fall precautions   Dizziness - suspect hyponatremia, initially was also orthostatic. Orthostatic hypotension Neck Pain  - chronic, degenerative joint disease MRI cervical spine shows degenerative changes with mild multifactorial spinal stenosis at multiple levels with mild associated cord mass effect but no signal abnormality.  Multilevel moderate degenerative neural foraminal stenosis most severe at the left C6 nerve level.  --Trial of Ativan  for twitching.   --Empiric course of steroids: IV Solu-Medrol >> prednisone taper for her neck pain  --Repeat orthostatic vitals daily --Holding BP meds, will allow some hypertension to offset orthostatic BP drop --Fall precautions  Small intestinal bacterial overgrowth (SIBO) --Changed Bactrim  >> Xifaxan .   Patient's daughter stating they are getting a prescription from [place], so will have this Rx at time of discharge.   --Discontinued Bactrim  because ?contributing to her symptoms and low sodium --Follow up with GI outpatient   Essential hypertension --Holding blood pressure medications due to orthostatic and dizziness   Hypothyroidism - TSH was normal early April --Continue Synthroid    Constipation No bowel movement yet since admission --Scheduled Miralax  BID --Add Senna-S BID --PRN suppository or enema   Anxiety and depression --On Zoloft , added Ativan .   GERD (gastroesophageal reflux disease) --PPI      Subjective: Pt seen with daughter at bedside today. Pt reports still having bad twitching, but overall improved with ativan .  She reports being tired, having intermittent hallucinations, but pt now understands she has been seeing things that aren't real.  This was scary for her when they were happening.  She reports badly needing to have a BM but cannot make anything come out.  Steroids seem to be helping neck pain.  Physical Exam: Vitals:   03/03/24 0727 03/03/24 1436 03/03/24 1957 03/04/24 0339  BP: (!) 161/88 124/61 (!) 110/58 (!) 143/79  Pulse: 73 72 74 74  Resp: 16 16 18 18   Temp: 98 F (36.7 C) 97.9 F (36.6 C) 99 F (37.2 C) 98.1 F (36.7  C)  TempSrc:   Oral Oral  SpO2: 100% 99% 97% 97%  Weight:      Height:       General exam: awake, alert, no acute distress, frail HEENT: healing abrasion on forehead and nasal bridge, dry mucus membranes, hearing grossly normal  Respiratory system: CTAB no wheezes, rales or rhonchi, normal respiratory effort. Cardiovascular system: normal S1/S2, RRR, no pedal edema.   Gastrointestinal system: soft, NT, ND, no HSM felt, +bowel sounds. Central nervous system: A&O x 3. no gross focal neurologic deficits, normal speech Extremities: moves all, no edema, normal tone Skin: dry, intact, normal temperature Psychiatry: normal mood, congruent affect, judgement and insight appear normal   Data Reviewed:  Notable labs --  Na 131 >> 127 >>125 Otherwise normal BMP and CBC  Serum osmolality low - 274   Family Communication: daughter at bedside on rounds  Disposition: Status is: Inpatient Remains inpatient appropriate because: persistent electrolyte derangements, ongoing evaluation   Planned Discharge Destination:  Home with hospice    Time spent: 52 minutes including time at bedside and in coordination of care  Author: Montey Apa, DO 03/04/2024 1:14 PM  For on call review [email or web address].

## 2024-03-05 DIAGNOSIS — R253 Fasciculation: Secondary | ICD-10-CM | POA: Diagnosis not present

## 2024-03-05 LAB — BASIC METABOLIC PANEL WITH GFR
Anion gap: 9 (ref 5–15)
BUN: 22 mg/dL (ref 8–23)
CO2: 26 mmol/L (ref 22–32)
Calcium: 9.9 mg/dL (ref 8.9–10.3)
Chloride: 99 mmol/L (ref 98–111)
Creatinine, Ser: 0.62 mg/dL (ref 0.44–1.00)
GFR, Estimated: >60 mL/min (ref >60)
Glucose, Bld: 90 mg/dL (ref 70–99)
Potassium: 3.8 mmol/L (ref 3.5–5.1)
Sodium: 134 mmol/L — ABNORMAL LOW (ref 135–145)

## 2024-03-05 LAB — MAGNESIUM: Magnesium: 2.2 mg/dL (ref 1.7–2.4)

## 2024-03-05 LAB — CORTISOL-AM, BLOOD: Cortisol - AM: 11.6 ug/dL (ref 6.7–22.6)

## 2024-03-05 LAB — PHOSPHORUS: Phosphorus: 2.5 mg/dL (ref 2.5–4.6)

## 2024-03-05 MED ORDER — SODIUM CHLORIDE 0.9 % IV SOLN: INTRAVENOUS | Status: AC

## 2024-03-05 NOTE — Progress Notes (Signed)
 Progress Note   Patient: Bonnie Barker NFA:[id number] DOB: 11/22/1928 DOA: 02/28/2024     5 DOS: the patient was seen and examined on 03/05/2024   Brief hospital course: "89 y.o. female with medical history significant of non-obstructive CAD, SIBO on Bactrim , HTN, HLD, hypothyroidism presenting with weakness, fall, hyponatremia.  Patient reports falling attempted going to bathroom today.  No reported LOC.  Positive head abrasion.  Noted to have been admitted April 7 through 8 4 8  for similar issues including near syncope as well as AKI.  Family reports overall decreased p.o. intake.  Has had recurrent bowel movements associated with SIBO.  No reported recent medication changes from discharge.  No fevers or chills.  Positive overall weakness.  Was also discharged with hospice.  Family still in process of working with hospice. Presented to the ER afebrile, hemodynamically stable.  Satting well on room air.  White count 5.5, hemoglobin 14.1, platelets 217.  Creatinine 0.83.  Sodium 123. CT head, CT C spine and CXR WNL.    4/26.  Patient complaining of neck pain, dizziness, poor appetite, abdominal discomfort, legs jumping especially at night.  Found to have low sodium.  Today sodium up to 127. 4/27.  Patient still complaining of neck pain and dizziness when moving her head.  Still having some jolting sensations in her legs and arms.  Patient orthostatic yesterday.  Given IV fluids.  Sodium a little lower today at 126.  Salt tablets 3 times daily prescribed 4/28.  Patient still having involuntary jerking of her arms and legs.  Reviewed MRI of the cervical spine showing degeneration with mild multifactorial spinal canal stenosis at C2-C3, C3-C4, C5-C6 and mild associated cord mass effect but no signal abnormality, multilevel moderate degenerative neural foraminal stenosis up to severe at left C6 nerve level.  Trial of Solu-Medrol .  Sodium 131 we will decrease salt tablets to twice daily. 4/29.  For the  patient's involuntary twitching will give a trial of Ativan .  In the afternoon she says her twitching is a little bit less but still doing it.  Sodium down to 127 will increase salt tablets to 3 times daily dosing.  Patient says her neck pain is a little bit better after IV Solu-Medrol ."   I assumed care on 03/04/24.  4/30 -- sodium down from peak of 131 >> 127 >> 125 again this AM.  Pt severely constipated.  Muscle twitching/jerking continue but seem little better with Ativan .  Steroids helping neck pain.   Assessment and Plan:  * Muscle twitching / myoclonic jerks Cervical myelopathy ruled out with MRI C-spine, while there is some mild cord compression, the cord signal with intact with abnormality.   Prior labs noted: iron studies, TSH, B12, vitamin D  -- all normal 02/10/24 --Continue trial low dose Ativan  - pt and daughter report helpful, but discussed side effects, increased fall risk, sedation.  Monitor closely and increase fall precautions. --Mgmt of neck pain as outlined --Check ammonia level    Hyponatremia - Na 123 on admission 4/25 (down from 130 on 02/11/24). Patient was started on IV fluids in the ED, appears sodium improved to 128. Then started on salt tablets.   Sodium improved to peak of 131 >> 127 >> 125 today 4/30 -- clinically pt appears hypovolemic, limited PO intake reported. Given 500 cc NS bolus and Na improved to 134 --Stop salt tablets --NS @ 75 cc/hr x 1 day --Daily weights to follow volume status --Repeat BMP AM, then in 1-2 weeks  Acute metabolic encephalopathy - with hallucinations, likely due to hyponatremia and hospital environment (delirium) --Mgmt of hyponatremia as outlined --Delirium precautions  Generalized Weakness / Fall - likely due to hyponatremia --PT/OT recommending SNF --Fall precautions   Dizziness - suspect hyponatremia, initially was also orthostatic. Orthostatic hypotension Neck Pain - chronic, degenerative joint disease MRI cervical  spine shows degenerative changes with mild multifactorial spinal stenosis at multiple levels with mild associated cord mass effect but no signal abnormality.  Multilevel moderate degenerative neural foraminal stenosis most severe at the left C6 nerve level.  --Trial of Ativan  for twitching.   --Empiric course of steroids: IV Solu-Medrol  >> prednisone  taper for her neck pain  --Repeat orthostatic vitals daily --Holding BP meds, will allow some hypertension to offset orthostatic BP drop --Fall precautions  Small intestinal bacterial overgrowth (SIBO) --Changed Bactrim  >> Xifaxan .   Patient's daughter stating they are getting a prescription from [place], so will have this Rx at time of discharge.   --Discontinued Bactrim  because ?contributing to her symptoms and low sodium --Follow up with GI outpatient   Essential hypertension --Holding blood pressure medications due to orthostatic and dizziness   Hypothyroidism - TSH was normal early April --Continue Synthroid    Constipation No bowel movement yet since admission --Scheduled Miralax  BID --Add Senna-S BID --PRN suppository or enema   Anxiety and depression --On Zoloft , added Ativan .   GERD (gastroesophageal reflux disease) --PPI      Subjective: Pt seen with daughter at bedside today. Pt reports being sleepy.  She had a good BM this morning.  Muscle twitches and jerks are better with ativan  but she is sleepy from it.  Daughter discusses how difficult it is to get patient to drink fluids at home.   Physical Exam: Vitals:   03/04/24 0339 03/04/24 1719 03/04/24 2007 03/05/24 0600  BP: (!) 143/79 138/89 (!) 152/72 (!) 163/84  Pulse: 74 78 95 79  Resp: 18 18 20 20   Temp: 98.1 F (36.7 C) 98.2 F (36.8 C) 98.4 F (36.9 C) 98 F (36.7 C)  TempSrc: Oral Axillary Oral Oral  SpO2: 97% 95% 99% 97%  Weight:      Height:       General exam: awake, alert, no acute distress, frail, drowsy appearing HEENT: healing abrasion on  forehead and nasal bridge, dry mucus membranes, hearing grossly normal  Respiratory system: CTAB no wheezes, rales or rhonchi, normal respiratory effort. Cardiovascular system: normal S1/S2, RRR, no pedal edema.   Gastrointestinal system: soft, NT, ND Central nervous system: A&O x 3. no gross focal neurologic deficits, normal speech Extremities: moves all, no edema, normal tone Skin: dry, intact, normal temperature Psychiatry: normal mood, congruent affect, judgement and insight appear normal   Data Reviewed:  Notable labs --   Na trend 131 >> 127 >>125 (4/30 - 500 cc bolus NS fluid challenge) >> 134 >> 134    Family Communication: daughter at bedside on rounds  Disposition: Status is: Inpatient Remains inpatient appropriate because: closely monitoring for stability of ongoing electrolyte derangements, further IV fluids today   Planned Discharge Destination:  Home with hospice    Time spent: 45 minutes  Author: Montey Apa, DO 03/05/2024 12:49 PM  For on call review [email or web address].

## 2024-03-05 NOTE — Progress Notes (Signed)
 [facility] rm 205 [facility] Hospitalized Patient   [name] is a current hospice followed at home for terminal diagnosis of Abnormal Weight Loss.  Patient was brought to the ED via EMS after she fell this morning walking to the bathroom due to weakness.  She was admitted to [facility] on 4.25.25 with diagnosis of hyponatremia.  Per Dr. Mitcheal Amy, hospice MD, this is a related hospitalization.   Patient asleep in chair upon visit. Spoke briefly with daughter in the hall. Patient had good BM this morning and will likely be ready for discharge tomorrow. Was given IV fluid trial yesterday for treatment of Hypnonatremia which seemed to be successful.    Patient is inpatient appropriate for skilled monitoring for recurrence of hyponatremia   Vital Signs: 98/71/20    163/84    spO2 97% room air   I&O: not recorded   Abnormal labs: Na+ 134   Diagnostics: None new    IV/PRN Meds: Ativan  0.5mg  po BID (trialing), NS 500cc bolus and now at @75cc [facility] Problem list: per progress note Dr. Darus Engels 5.1.25   Muscle twitching / myoclonic jerks Cervical myelopathy ruled out with MRI C-spine, while there is some mild cord compression, the cord signal with intact with abnormality.   Prior labs noted: iron studies, TSH, B12, vitamin D  -- all normal 02/10/24 --Continue trial low dose Ativan  - pt and daughter report helpful, but discussed side effects, increased fall risk, sedation.  Monitor closely and increase fall precautions. --Mgmt of neck pain as outlined --Check ammonia level     Hyponatremia - Na 123 on admission 4/25 (down from 130 on 02/11/24). Patient was started on IV fluids in the ED, appears sodium improved to 128. Then started on salt tablets.   Sodium improved to peak of 131 >> 127 >> 125 today 4/30 -- clinically pt appears hypovolemic, limited PO intake reported. Given 500 cc NS bolus and Na improved to 134 --Stop salt  tablets --NS @ 75 cc/hr x 1 day --Daily weights to follow volume status --Repeat BMP AM, then in 1-2 weeks     Acute metabolic encephalopathy - with hallucinations, likely due to hyponatremia and hospital environment (delirium) --Mgmt of hyponatremia as outlined --Delirium precautions   Generalized Weakness / Fall - likely due to hyponatremia --PT/OT recommending SNF --Fall precautions   Dizziness - suspect hyponatremia, initially was also orthostatic. Orthostatic hypotension Neck Pain - chronic, degenerative joint disease MRI cervical spine shows degenerative changes with mild multifactorial spinal stenosis at multiple levels with mild associated cord mass effect but no signal abnormality.  Multilevel moderate degenerative neural foraminal stenosis most severe at the left C6 nerve level.  --Trial of Ativan  for twitching.   --Empiric course of steroids: IV Solu-Medrol  >> prednisone  taper for her neck pain  --Repeat orthostatic vitals daily --Holding BP meds, will allow some hypertension to offset orthostatic BP drop --Fall precautions   Discharge Planning: ongoing.  No d/c today Family contact: Daughter at the bedside.   IDT:  Updated  Goals of Care-  Full Code  Please don't hesitate to reach out for any hospice related questions or concerns Dwane Gitelman, BSN, RN Hospice hospital liaison [phone]

## 2024-03-05 NOTE — Progress Notes (Signed)
 Physical Therapy Treatment Patient Details Name: Bonnie Barker MRN: [id number] DOB: 1929-04-07 Today's Date: 03/05/2024   History of Present Illness Bonnie Barker is a 89 y.o. female with medical history significant of non-obstructive CAD, SIBO on Bactrim , HTN, HLD, hypothyroidism presenting with weakness, fall, hyponatremia.    PT Comments  Patient alert agreeable to PT, finishing breakfast upon arrival. BP assessed in supine and in sitting (in sitting several times), WFLs and pt reported mild dizziness but no orthostatics noted this AM. HR elevated to 120s, activity modified as able. modA to transition to EOB, extra time. Sit <> stand modA, and able to step pivot to [facility], and to recliner. totalA for pericare from second assist. Pt up in chair with needs in reach at end of session. The patient would benefit from further skilled PT intervention to continue to progress towards goals.      If plan is discharge home, recommend the following: A little help with walking and/or transfers;A little help with bathing/dressing/bathroom;Assistance with cooking/housework;Help with stairs or ramp for entrance;Assist for transportation   Can travel by private vehicle        Equipment Recommendations  None recommended by PT    Recommendations for Other Services       Precautions / Restrictions Precautions Precautions: Fall Recall of Precautions/Restrictions: Impaired Restrictions Weight Bearing Restrictions Per Provider Order: No     Mobility  Bed Mobility Overal bed mobility: Needs Assistance Bed Mobility: Supine to Sit     Supine to sit: Mod assist          Transfers Overall transfer level: Needs assistance Equipment used: None Transfers: Sit to/from Stand, Bed to chair/wheelchair/BSC Sit to Stand: Mod assist   Step pivot transfers: Mod assist       General transfer comment: modA to step pivot to [facility], to recliner, able to statically stand for pericare, modA-minA for  posterior lean    Ambulation/Gait               General Gait Details: deferred, elevated HR   Stairs             Wheelchair Mobility     Tilt Bed    Modified Rankin (Stroke Patients Only)       Balance Overall balance assessment: Needs assistance Sitting-balance support: No upper extremity supported, Feet supported Sitting balance-Leahy Scale: Fair     Standing balance support: Bilateral upper extremity supported, During functional activity Standing balance-Leahy Scale: Poor                              Communication    Cognition Arousal: Alert Behavior During Therapy: WFL for tasks assessed/performed   PT - Cognitive impairments: No apparent impairments                       PT - Cognition Comments: oriented to person, family in room asking about location Following commands: Intact      Cueing Cueing Techniques: Verbal cues  Exercises Other Exercises Other Exercises: BP assessed, WFLs though family reported values were low for the pt    General Comments        Pertinent Vitals/Pain Pain Assessment Pain Assessment: Faces Faces Pain Scale: Hurts a little bit Pain Location: abdomen Pain Descriptors / Indicators: Aching, Grimacing, Sore Pain Intervention(s): Limited activity within patient's tolerance, Monitored during session, Repositioned    Home Living  Prior Function            PT Goals (current goals can now be found in the care plan section) Progress towards PT goals: Progressing toward goals    Frequency    Min 2X/week      PT Plan      Co-evaluation              AM-PAC PT "6 Clicks" Mobility   Outcome Measure  Help needed turning from your back to your side while in a flat bed without using bedrails?: A Little Help needed moving from lying on your back to sitting on the side of a flat bed without using bedrails?: A Little Help needed moving to and from a  bed to a chair (including a wheelchair)?: A Little Help needed standing up from a chair using your arms (e.g., wheelchair or bedside chair)?: A Lot Help needed to walk in hospital room?: A Lot Help needed climbing 3-5 steps with a railing? : Total 6 Click Score: 14    End of Session   Activity Tolerance: Patient tolerated treatment well Patient left: with call bell/phone within reach;with family/visitor present;with chair alarm set Nurse Communication: Mobility status PT Visit Diagnosis: Unsteadiness on feet (R26.81);Muscle weakness (generalized) (M62.81)     Time: 1610-9604 PT Time Calculation (min) (ACUTE ONLY): 23 min  Charges:    $Therapeutic Activity: 23-37 mins PT General Charges $$ ACUTE PT VISIT: 1 Visit                     Darien Eden PT, DPT 10:35 AM,03/05/24

## 2024-03-05 NOTE — Plan of Care (Signed)
  Problem: Education: Goal: Knowledge of General Education information will improve Description: Including pain rating scale, medication(s)/side effects and non-pharmacologic comfort measures Outcome: Progressing   Problem: Health Behavior/Discharge Planning: Goal: Ability to manage health-related needs will improve Outcome: Progressing   Problem: Clinical Measurements: Goal: Ability to maintain clinical measurements within normal limits will improve Outcome: Progressing Goal: Will remain free from infection Outcome: Progressing Goal: Diagnostic test results will improve Outcome: Progressing Goal: Respiratory complications will improve Outcome: Progressing Goal: Cardiovascular complication will be avoided Outcome: Progressing   Problem: Nutrition: Goal: Adequate nutrition will be maintained Outcome: Progressing   Problem: Elimination: Goal: Will not experience complications related to bowel motility Outcome: Progressing Goal: Will not experience complications related to urinary retention Outcome: Progressing   Problem: Pain Managment: Goal: General experience of comfort will improve and/or be controlled Outcome: Progressing

## 2024-03-06 DIAGNOSIS — R253 Fasciculation: Secondary | ICD-10-CM | POA: Diagnosis not present

## 2024-03-06 LAB — BASIC METABOLIC PANEL WITH GFR
Anion gap: 6 (ref 5–15)
BUN: 20 mg/dL (ref 8–23)
CO2: 26 mmol/L (ref 22–32)
Calcium: 9 mg/dL (ref 8.9–10.3)
Chloride: 102 mmol/L (ref 98–111)
Creatinine, Ser: 0.64 mg/dL (ref 0.44–1.00)
GFR, Estimated: >60 mL/min (ref >60)
Glucose, Bld: 90 mg/dL (ref 70–99)
Potassium: 3.9 mmol/L (ref 3.5–5.1)
Sodium: 134 mmol/L — ABNORMAL LOW (ref 135–145)

## 2024-03-06 MED ORDER — RIFAXIMIN 550 MG PO TABS: 550.0000 mg | ORAL_TABLET | Freq: Three times a day (TID) | ORAL | Status: AC

## 2024-03-06 MED ORDER — PREDNISONE 5 MG PO TABS
ORAL_TABLET | ORAL | 0 refills | Status: AC
Start: 2024-03-06 — End: 2024-03-12

## 2024-03-06 MED ORDER — LORAZEPAM 0.5 MG PO TABS: 0.5000 mg | ORAL_TABLET | Freq: Two times a day (BID) | ORAL | 0 refills | Status: AC

## 2024-03-06 MED ORDER — SENNOSIDES-DOCUSATE SODIUM 8.6-50 MG PO TABS: 1.0000 | ORAL_TABLET | Freq: Two times a day (BID) | ORAL | 0 refills | Status: AC

## 2024-03-06 MED ORDER — ACETAMINOPHEN 325 MG PO TABS: 650.0000 mg | ORAL_TABLET | Freq: Four times a day (QID) | ORAL | Status: AC | PRN

## 2024-03-06 MED ORDER — BISACODYL 10 MG RE SUPP: 10.0000 mg | Freq: Every day | RECTAL | 0 refills | Status: AC | PRN

## 2024-03-06 MED ORDER — TRIPLE ANTIBIOTIC 3.5-400-5000 EX OINT: 1.0000 | TOPICAL_OINTMENT | Freq: Three times a day (TID) | CUTANEOUS | 0 refills | Status: AC

## 2024-03-06 MED ORDER — ENSURE ENLIVE PO LIQD: 237.0000 mL | Freq: Three times a day (TID) | ORAL | Status: AC

## 2024-03-06 MED ORDER — POLYETHYLENE GLYCOL 3350 17 G PO PACK: 17.0000 g | PACK | Freq: Two times a day (BID) | ORAL | 0 refills | Status: AC

## 2024-03-06 NOTE — Discharge Summary (Incomplete)
 Physician Discharge Summary   Patient: Bonnie Barker MRN: [id number] DOB: Oct 02, 1929  Admit date:     02/28/2024  Discharge date: {dischdate:26783}  Discharge Physician: Montey Apa   PCP: Lyle San, MD   Recommendations at discharge:  {Tip this will not be part of the note when signed- Example include specific recommendations for outpatient follow-up, pending tests to follow-up on. (Optional):26781}  ***  Discharge Diagnoses: Principal Problem:   Muscle twitching Active Problems:   Hyponatremia   Dizziness   Orthostatic hypotension   Small intestinal bacterial overgrowth (SIBO)   Essential hypertension   Hypothyroidism   GERD (gastroesophageal reflux disease)   CAD (coronary artery disease)   Anxiety and depression   Constipation  Resolved Problems:   * No resolved hospital problems. *  Hospital Course: 89 y.o. female with medical history significant of non-obstructive CAD, SIBO on Bactrim , HTN, HLD, hypothyroidism presenting with weakness, fall, hyponatremia.  Patient reports falling attempted going to bathroom today.  No reported LOC.  Positive head abrasion.  Noted to have been admitted April 7 through 8 4 8  for similar issues including near syncope as well as AKI.  Family reports overall decreased p.o. intake.  Has had recurrent bowel movements associated with SIBO.  No reported recent medication changes from discharge.  No fevers or chills.  Positive overall weakness.  Was also discharged with hospice.  Family still in process of working with hospice. Presented to the ER afebrile, hemodynamically stable.  Satting well on room air.  White count 5.5, hemoglobin 14.1, platelets 217.  Creatinine 0.83.  Sodium 123. CT head, CT C spine and CXR WNL.   4/26.  Patient complaining of neck pain, dizziness, poor appetite, abdominal discomfort, legs jumping especially at night.  Found to have low sodium.  Today sodium up to 127. 4/27.  Patient still complaining of neck pain  and dizziness when moving her head.  Still having some jolting sensations in her legs and arms.  Patient orthostatic yesterday.  Given IV fluids.  Sodium a little lower today at 126.  Salt tablets 3 times daily prescribed 4/28.  Patient still having involuntary jerking of her arms and legs.  Reviewed MRI of the cervical spine showing degeneration with mild multifactorial spinal canal stenosis at C2-C3, C3-C4, C5-C6 and mild associated cord mass effect but no signal abnormality, multilevel moderate degenerative neural foraminal stenosis up to severe at left C6 nerve level.  Trial of Solu-Medrol .  Sodium 131 we will decrease salt tablets to twice daily. 4/29.  For the patient's involuntary twitching will give a trial of Ativan .  In the afternoon she says her twitching is a little bit less but still doing it.  Sodium down to 127 will increase salt tablets to 3 times daily dosing.  Patient says her neck pain is a little bit better after IV Solu-Medrol .  Assessment and Plan: * Muscle twitching Did not improve initially with a trial of Requip .  Will try Ativan  and see if that helps.  On reevaluation this afternoon she said it helped a little bit.  Would likely continue this medication upon discharge.  Hyponatremia Weakness Fall Sodium 127.  Increase salt tablets to 3 times daily dosing.  Dizziness Muscle twitching also.  Patient was orthostatic initially.  MRI cervical spine shows degenerative changes with mild multifactorial spinal stenosis at multiple levels with mild associated cord mass effect but no signal abnormality.  Multilevel moderate degenerative neural foraminal stenosis most severe at the left C6 nerve level.  Trial of Ativan  for twitching.  Will switch Solu-Medrol  over to prednisone  taper for her neck pain since she said her neck pain was better after Solu-Medrol .  Orthostatic hypotension Holding off blood pressure medication secondary to orthostatic hypotension would rather have her blood  pressure on the higher side.  Small intestinal bacterial overgrowth (SIBO) Changed over to Xifaxan .  Patient's daughter stating they are getting a prescription from [place].  Discontinued Bactrim  because this is likely contributing to her symptoms and low sodium  Essential hypertension Holding blood pressure medications.  Patient orthostatic on the other day  Hypothyroidism Continue Synthroid   Constipation No bowel movement yet.  Will give another dose of lactulose  and make MiraLAX  twice daily  Anxiety and depression On Zoloft , added Ativan .  GERD (gastroesophageal reflux disease) PPI      {Tip this will not be part of the note when signed Body mass index is 18.98 kg/m. , ,  (Optional):26781}  {(NOTE) Pain control PDMP Statment (Optional):26782} Consultants: *** Procedures performed: ***  Disposition: {Plan; Disposition:26390} Diet recommendation:  {Diet_Plan:26776} DISCHARGE MEDICATION: Allergies as of 03/06/2024       Reactions   Acthib [haemophilus B Polysacc Tetanus Toxoid Conj Vaccine]    Apap [acetaminophen ]    Banana Concentrate [banana]    Benadryl [diphenhydramine Hcl (sleep)]    Bentyl [dicyclomine Hcl]    Biaxin [clarithromycin]    Codeine    Eryc [erythromycin]    Etodolac    Flagyl [metronidazole]    Fosamax [alendronate Sodium]    Penicillins    Shellfish Allergy    Statins    Tetracyclines & Related    Tramadol    Vibramycin [doxycycline Calcium]      Med Rec must be completed prior to using this [facility]***       Follow-up Information     [facility] at [facility].   Specialty: Emergency Medicine Why: If symptoms worsen Contact information: [address] [place]  [phone]        Schedule an appointment as soon as possible for a visit  with Lyle San, MD.   Specialty: Family Medicine Why: for recheck of sodium levels Contact information: [address] [place] [address] [phone]                Discharge Exam: Cleavon Curls Weights   02/28/24 0703 03/06/24 0500  Weight: 57.7 kg 48.6 kg   ***  Condition at discharge: {DC Condition:26389}  The results of significant diagnostics from this hospitalization (including imaging, microbiology, ancillary and laboratory) are listed below for reference.   Imaging Studies: MR CERVICAL SPINE WO CONTRAST Result Date: 03/01/2024 CLINICAL DATA:  89 year old female with ataxia, dizziness, falls. EXAM: MRI CERVICAL SPINE WITHOUT CONTRAST TECHNIQUE: Multiplanar, multisequence MR imaging of the cervical spine was performed. No intravenous contrast was administered. COMPARISON:  Cervical spine CT 02/28/2024. FINDINGS: Alignment: Stable straightening of the cervical spine, mildly exaggerated C2 level lordosis. Vertebrae: Normal background bone marrow signal. No marrow edema or evidence of acute osseous abnormality. Cord: Degenerative mass effect on the dorsal upper cervical spinal cord, perhaps also the lower cord related to degenerative spinal stenosis as detailed below. But no cord signal abnormality is identified. Posterior Fossa, vertebral arteries, paraspinal tissues: Cervicomedullary junction is within normal limits. Grossly negative for age visible brain parenchyma. Preserved major vascular flow voids in the bilateral neck. Negative visible neck soft tissues. No signal abnormality identified in the cervical spine ligamentous complex. Disc levels:  C2-C3: Mild disc bulging posteriorly but moderate ligament flavum hypertrophy. This is partially calcified by CT. Mild spinal stenosis and mass effect on the dorsal spinal cord (series 5, image 8 and series 9, image 4). No foraminal stenosis. C3-C4: Circumferential disc bulging and endplate spurring asymmetric to the right with a small central component of disc on series 9, image 9. Moderate facet and ligament flavum hypertrophy bilaterally.  Mild spinal stenosis. Mild if any cord mass effect. Mild left, moderate right C4 foraminal stenosis. C4-C5: Disc space loss. Mild circumferential disc osteophyte complex. Up to moderate facet and ligament flavum hypertrophy. No significant spinal stenosis. Moderate left C5 foraminal stenosis. C5-C6: Similar disc space loss. Circumferential disc osteophyte complex. Mild to moderate facet and ligament flavum hypertrophy. Mild spinal stenosis, mild if any cord mass effect (series 8, image 18). Moderate to severe left and mild-to-moderate right C6 foraminal stenosis. C6-C7: Similar disc space loss. Circumferential disc osteophyte complex. Mild to moderate facet and ligament flavum hypertrophy greater on the left. No spinal stenosis. Mild left but moderate right C7 foraminal stenosis. C7-T1: Mild foraminal endplate and facet spurring. No significant stenosis. Visible upper thoracic levels appear normal for age. IMPRESSION: 1. Cervical spine degeneration with mild multifactorial spinal stenosis, C2-C3, C3-C4, C5-C6. Up to mild associated cord mass effect, but no cord signal abnormality. 2. Multilevel moderate degenerative neural foraminal stenosis, up to severe at the left C6 nerve level. Electronically Signed   By: Marlise Simpers M.D.   On: 03/01/2024 17:23   DG Chest 2 View Result Date: 02/28/2024 CLINICAL DATA:  Weakness, fall. EXAM: CHEST - 2 VIEW COMPARISON:  May 20, 2022. FINDINGS: The heart size and mediastinal contours are within normal limits. Both lungs are clear. The visualized skeletal structures are unremarkable. IMPRESSION: No active cardiopulmonary disease. Electronically Signed   By: Rosalene Colon M.D.   On: 02/28/2024 08:23   CT Head Wo Contrast Result Date: 02/28/2024 CLINICAL DATA:  Neck trauma.  Fall in bathroom EXAM: CT HEAD WITHOUT CONTRAST CT CERVICAL SPINE WITHOUT CONTRAST TECHNIQUE: Multidetector CT imaging of the head and cervical spine was performed following the standard protocol without  intravenous contrast. Multiplanar CT image reconstructions of the cervical spine were also generated. RADIATION DOSE REDUCTION: This exam was performed according to the departmental dose-optimization program which includes automated exposure control, adjustment of the mA and/or kV according to patient size and/or use of iterative reconstruction technique. COMPARISON:  None Available. FINDINGS: CT HEAD FINDINGS Brain: No evidence of acute infarction, hemorrhage, hydrocephalus, extra-axial collection or mass lesion/mass effect. Generalized atrophy and widespread chronic small vessel ischemia in the cerebral white matter. Vascular: No hyperdense vessel or unexpected calcification. Skull: No acute fracture Sinuses/Orbits: No evidence of injury CT CERVICAL SPINE FINDINGS Alignment: No traumatic malalignment Skull base and vertebrae: No acute fracture. No primary bone lesion or focal pathologic process. Soft tissues and spinal canal: No prevertebral fluid or swelling. No visible canal hematoma. Disc levels:  Ordinary degenerative change Upper chest: Biapical reticulation and volume loss attributed to scarring, stable from 2023 chest CT IMPRESSION: No evidence of acute intracranial or cervical spine injury. Electronically Signed   By: Ronnette Coke M.D.   On: 02/28/2024 07:40   CT Cervical Spine Wo Contrast Result Date: 02/28/2024 CLINICAL DATA:  Neck trauma.  Fall in bathroom EXAM: CT HEAD WITHOUT CONTRAST CT CERVICAL SPINE WITHOUT CONTRAST TECHNIQUE: Multidetector CT imaging of the head and cervical spine was performed following the standard protocol without intravenous contrast. Multiplanar CT image reconstructions  of the cervical spine were also generated. RADIATION DOSE REDUCTION: This exam was performed according to the departmental dose-optimization program which includes automated exposure control, adjustment of the mA and/or kV according to patient size and/or use of iterative reconstruction technique.  COMPARISON:  None Available. FINDINGS: CT HEAD FINDINGS Brain: No evidence of acute infarction, hemorrhage, hydrocephalus, extra-axial collection or mass lesion/mass effect. Generalized atrophy and widespread chronic small vessel ischemia in the cerebral white matter. Vascular: No hyperdense vessel or unexpected calcification. Skull: No acute fracture Sinuses/Orbits: No evidence of injury CT CERVICAL SPINE FINDINGS Alignment: No traumatic malalignment Skull base and vertebrae: No acute fracture. No primary bone lesion or focal pathologic process. Soft tissues and spinal canal: No prevertebral fluid or swelling. No visible canal hematoma. Disc levels:  Ordinary degenerative change Upper chest: Biapical reticulation and volume loss attributed to scarring, stable from 2023 chest CT IMPRESSION: No evidence of acute intracranial or cervical spine injury. Electronically Signed   By: Ronnette Coke M.D.   On: 02/28/2024 07:40    Microbiology: No results found for this or any previous visit.  Labs: CBC: Recent Labs  Lab 02/29/24 0241 03/04/24 0428  WBC 5.9 6.6  HGB 12.6 12.7  HCT 36.3 38.4  MCV 87.5 90.4  PLT 219 252   Basic Metabolic Panel: Recent Labs  Lab 03/02/24 0724 03/03/24 0736 03/04/24 0428 03/04/24 1626 03/05/24 0410 03/06/24 0418  NA 131* 127* 125* 134* 134* 134*  K 4.2 3.8 3.9  --  3.8 3.9  CL 96* 96* 99  --  99 102  CO2 27 27 25   --  26 26  GLUCOSE 94 88 79  --  90 90  BUN 13 20 20   --  22 20  CREATININE 0.73 0.83 0.54  --  0.62 0.64  CALCIUM 9.7 9.6 10.1  --  9.9 9.0  MG  --   --   --   --  2.2  --   PHOS  --   --   --   --  2.5  --    Liver Function Tests: Recent Labs  Lab 02/29/24 0241  AST 24  ALT 17  ALKPHOS 51  BILITOT 0.6  PROT 6.3*  ALBUMIN 3.7   CBG: No results for input(s): "GLUCAP" in the last 168 hours.  Discharge time spent: {LESS THAN/GREATER WUJW:11914} 30 minutes.  Signed: Montey Apa, DO [facility] 03/06/2024

## 2024-03-06 NOTE — Plan of Care (Signed)

## 2024-03-06 NOTE — TOC Transition Note (Addendum)
 Transition of Care [facility]) - Discharge Note   Patient Details  Name: AMAHIA KUMAGAI MRN: [id number] Date of Birth: [birth year]  Transition of Care [facility] CM/SW Contact:  Loman Risk, RN Phone Number: 03/06/2024, 2:55 PM   Clinical Narrative:     Plan to discharge today with home with hospice.  Deborrah Fam with [employer] notified of discharge  Update: Per [place] with [employer] hospital bed, BSC, transport wheelchair and 2 wheeled walker needed prior to discharge.  To be ordered by [employer] and plan for patient to dc tomorrow      Barriers to Discharge: Continued Medical Work up   Patient Goals and CMS Choice     Choice offered to / list presented to : Patient, Adult Children      Discharge Placement                       Discharge Plan and Services Additional resources added to the After Visit Summary for       Post Acute Care Choice: Resumption of Svcs/PTA Provider                               Social Drivers of Health (SDOH) Interventions SDOH Screenings   Food Insecurity: No Food Insecurity (02/28/2024)  Housing: Low Risk  (02/28/2024)  Transportation Needs: No Transportation Needs (02/28/2024)  Utilities: Not At Risk (02/28/2024)  Financial Resource Strain: Patient Declined (08/15/2023)   Received from [facility]  Social Connections: Socially Isolated (02/28/2024)  Tobacco Use: Low Risk  (02/28/2024)     Readmission Risk Interventions    03/06/2024    2:54 PM  Readmission Risk Prevention Plan  Transportation Screening Complete  HRI or Home Care Consult Complete  Palliative Care Screening Complete  Medication Review (RN Care Manager) Complete

## 2024-03-06 NOTE — Progress Notes (Signed)
 [facility] rm 205 [facility] Hospitalized Patient   [name] is a current hospice followed at home for terminal diagnosis of Abnormal Weight Loss.  Patient was brought to the ED via EMS after she fell this morning walking to the bathroom due to weakness.  She was admitted to [facility] on 4.25.25 with diagnosis of hyponatremia.  Per Dr. Mitcheal Amy, hospice MD, this is a related hospitalization.   Visited with patient and her daughter at bedside this afternoon.  Patient sitting up in chair covered in a blanket complaining of being cold.  The room was on the cold side.  Discussed planned discharge.  Patient has no DME at home currently.  DME needs:  hospital bed with 2 rails, over bed table, BSC, transport wheelchair and 2 wheeled walker.  DME will be delivered tonight with discharge planned tomorrow morning.  Discussed in detail what care might look like at home.  Bonnie Barker very apprehensive about taking care of her mom and not having everything she needs.  Bonnie Barker lives out of town and has never done this type of care.  [name] is very independent and has been resistant to letting people to do things for her.  Hospice nurse will plan to make a visit after patient gets home tomorrow to educate [place] on care.  Supplies needed at hospice nurse visit:  large gloves, small pull ups and chux.   Patient is inpatient appropriate for skilled monitoring for recurrence of hyponatremia   Vital Signs:  98.7 oral, BP 141/78, P 110, R 16, Oxi 98% RA  I&O: not recorded   Abnormal labs:  Na 134   Diagnostics: None new    IV/PRN Meds: Ativan  0.5mg  po BID (trialing), NS 500cc bolus and now at @75cc [facility] Problem list: per progress note Dr. Darus Engels 5.1.25 (no note in yet for 5.2.25    Muscle twitching / myoclonic jerks Cervical myelopathy ruled out with MRI C-spine, while there is some mild cord compression, the cord signal with intact with abnormality.    Prior labs noted: iron studies, TSH, B12, vitamin D  -- all normal 02/10/24 --Continue trial low dose Ativan  - pt and daughter report helpful, but discussed side effects, increased fall risk, sedation.  Monitor closely and increase fall precautions. --Mgmt of neck pain as outlined --Check ammonia level   Hyponatremia - Na 123 on admission 4/25 (down from 130 on 02/11/24). Patient was started on IV fluids in the ED, appears sodium improved to 128. Then started on salt tablets.   Sodium improved to peak of 131 >> 127 >> 125 today 4/30 -- clinically pt appears hypovolemic, limited PO intake reported. Given 500 cc NS bolus and Na improved to 134 --Stop salt tablets --NS @ 75 cc/hr x 1 day --Daily weights to follow volume status --Repeat BMP AM, then in 1-2 weeks   Acute metabolic encephalopathy - with hallucinations, likely due to hyponatremia and hospital environment (delirium) --Mgmt of hyponatremia as outlined --Delirium precautions   Generalized Weakness / Fall - likely due to hyponatremia --PT/OT recommending SNF --Fall precautions   Dizziness - suspect hyponatremia, initially was also orthostatic. Orthostatic hypotension Neck Pain - chronic, degenerative joint disease MRI cervical spine shows degenerative changes with mild multifactorial spinal stenosis at multiple levels with mild associated cord mass effect but no signal abnormality.  Multilevel moderate degenerative neural foraminal stenosis most severe at the left C6 nerve level.  --Trial of Ativan  for twitching.   --Empiric course  of steroids: IV Solu-Medrol  >> prednisone  taper for her neck pain  --Repeat orthostatic vitals daily --Holding BP meds, will allow some hypertension to offset orthostatic BP drop --Fall precautions   Small intestinal bacterial overgrowth (SIBO) --Changed Bactrim  >> Xifaxan .   Patient's daughter stating they are getting a prescription from [place], so will have this Rx at time of discharge.    --Discontinued Bactrim  because ?contributing to her symptoms and low sodium --Follow up with GI outpatient   Essential hypertension --Holding blood pressure medications due to orthostatic and dizziness   Hypothyroidism - TSH was normal early April --Continue Synthroid    Constipation No bowel movement yet since admission --Scheduled Miralax  BID --Add Senna-S BID --PRN suppository or enema   Anxiety and depression --On Zoloft , added Ativan .   GERD (gastroesophageal reflux disease) --PPI  Discharge Planning: DC home tomorrow Family contact: Vickie, daughter at bedside IDT:  Updated  Goals of Care-  Full Code   Please don't hesitate to reach out for any hospice related questions or concerns  Ambrosio Junker, RN Nurse Liaison [phone]

## 2024-03-07 DIAGNOSIS — R253 Fasciculation: Secondary | ICD-10-CM | POA: Diagnosis not present

## 2024-03-07 NOTE — Progress Notes (Addendum)
 Progress Note   Patient: Bonnie Barker DGU:[id number] DOB: 02-24-1929 DOA: 02/28/2024     7 DOS: the patient was seen and examined on 03/07/2024   Brief hospital course: "89 y.o. female with medical history significant of non-obstructive CAD, SIBO on Bactrim , HTN, HLD, hypothyroidism presenting with weakness, fall, hyponatremia.  Patient reports falling attempted going to bathroom today.  No reported LOC.  Positive head abrasion.  Noted to have been admitted April 7 through 8 4 8  for similar issues including near syncope as well as AKI.  Family reports overall decreased p.o. intake.  Has had recurrent bowel movements associated with SIBO.  No reported recent medication changes from discharge.  No fevers or chills.  Positive overall weakness.  Was also discharged with hospice.  Family still in process of working with hospice. Presented to the ER afebrile, hemodynamically stable.  Satting well on room air.  White count 5.5, hemoglobin 14.1, platelets 217.  Creatinine 0.83.  Sodium 123. CT head, CT C spine and CXR WNL.    4/26.  Patient complaining of neck pain, dizziness, poor appetite, abdominal discomfort, legs jumping especially at night.  Found to have low sodium.  Today sodium up to 127. 4/27.  Patient still complaining of neck pain and dizziness when moving her head.  Still having some jolting sensations in her legs and arms.  Patient orthostatic yesterday.  Given IV fluids.  Sodium a little lower today at 126.  Salt tablets 3 times daily prescribed 4/28.  Patient still having involuntary jerking of her arms and legs.  Reviewed MRI of the cervical spine showing degeneration with mild multifactorial spinal canal stenosis at C2-C3, C3-C4, C5-C6 and mild associated cord mass effect but no signal abnormality, multilevel moderate degenerative neural foraminal stenosis up to severe at left C6 nerve level.  Trial of Solu-Medrol .  Sodium 131 we will decrease salt tablets to twice daily. 4/29.  For the  patient's involuntary twitching will give a trial of Ativan .  In the afternoon she says her twitching is a little bit less but still doing it.  Sodium down to 127 will increase salt tablets to 3 times daily dosing.  Patient says her neck pain is a little bit better after IV Solu-Medrol ."   I assumed care on 03/04/24.  4/30 -- sodium down from peak of 131 >> 127 >> 125 again this AM.  Pt severely constipated.  Muscle twitching/jerking continue but seem little better with Ativan .  Steroids helping neck pain.  5/1 -- Na 134 5/2 -- Na 134   Assessment and Plan:  * Muscle twitching / myoclonic jerks Cervical myelopathy ruled out with MRI C-spine, while there is some mild cord compression, the cord signal with intact with abnormality.   Prior labs noted: iron studies, TSH, B12, vitamin D  -- all normal 02/10/24 --Continue trial low dose Ativan  - pt and daughter report helpful, but discussed side effects, increased fall risk, sedation.  Monitor closely and increase fall precautions. --Mgmt of neck pain as outlined --Check ammonia level    Hyponatremia - Na 123 on admission 4/25 (down from 130 on 02/11/24). Patient was started on IV fluids in the ED, appears sodium improved to 128. Then started on salt tablets.   Sodium improved to peak of 131 >> 127 >> 125 today 4/30 -- clinically pt appears hypovolemic, limited PO intake reported. Given 500 cc NS bolus and Na improved to 134 --Stop salt tablets --NS @ 75 cc/hr x 1 day --Daily weights to follow volume  status --Repeat BMP AM, then in 1-2 weeks   Acute metabolic encephalopathy - with hallucinations, likely due to hyponatremia and hospital environment (delirium) --Mgmt of hyponatremia as outlined --Delirium precautions  Generalized Weakness / Fall - likely due to hyponatremia --PT/OT recommending SNF --Fall precautions   Dizziness - suspect hyponatremia, initially was also orthostatic. Orthostatic hypotension Neck Pain - chronic,  degenerative joint disease MRI cervical spine shows degenerative changes with mild multifactorial spinal stenosis at multiple levels with mild associated cord mass effect but no signal abnormality.  Multilevel moderate degenerative neural foraminal stenosis most severe at the left C6 nerve level.  --Trial of Ativan  for twitching.   --Empiric course of steroids: IV Solu-Medrol  >> prednisone  taper for her neck pain  --Repeat orthostatic vitals daily --Holding BP meds, will allow some hypertension to offset orthostatic BP drop --Fall precautions  Small intestinal bacterial overgrowth (SIBO) --Changed Bactrim  >> Xifaxan .   Patient's daughter stating they are getting a prescription from [place], so will have this Rx at time of discharge.   --Discontinued Bactrim  because ?contributing to her symptoms and low sodium --Follow up with GI outpatient   Essential hypertension --Holding blood pressure medications due to orthostatic and dizziness   Hypothyroidism - TSH was normal early April --Continue Synthroid    Constipation No bowel movement yet since admission --Scheduled Miralax  BID --Add Senna-S BID --PRN suppository or enema   Anxiety and depression --On Zoloft , added Ativan .   GERD (gastroesophageal reflux disease) --PPI      Subjective: Pt seen with daughter at bedside today. Pt reports being sleepy.  She had a good BM this morning.  Muscle twitches and jerks are better with ativan  but she is sleepy from it.  Daughter discusses how difficult it is to get patient to drink fluids at home.   Physical Exam: Vitals:   03/07/24 0131 03/07/24 0336 03/07/24 0338 03/07/24 0340  BP:  (!) 166/91 (!) 147/86 (!) 167/76  Pulse:  77 79 84  Resp:  16 16 16   Temp:  97.9 F (36.6 C)  98 F (36.7 C)  TempSrc:  Oral    SpO2:  97% 98% 99%  Weight: 48.7 kg     Height:       General exam: awake, alert, no acute distress, frail, drowsy appearing HEENT: healing abrasion on forehead and nasal  bridge, dry mucus membranes, hearing grossly normal  Respiratory system: CTAB no wheezes, rales or rhonchi, normal respiratory effort. Cardiovascular system: normal S1/S2, RRR, no pedal edema.   Gastrointestinal system: soft, NT, ND Central nervous system: A&O x 3. no gross focal neurologic deficits, normal speech Extremities: moves all, no edema, normal tone Skin: dry, intact, normal temperature Psychiatry: normal mood, congruent affect, judgement and insight appear normal   Data Reviewed:  Notable labs --   Na trend 131 >> 127 >>125 (4/30 - 500 cc bolus NS fluid challenge) >> 134 >> 134    Family Communication: daughter at bedside on rounds  Disposition: Status is: Inpatient Remains inpatient appropriate because: closely monitoring for stability of ongoing electrolyte derangements, further IV fluids today   Planned Discharge Destination:  Home with hospice    Time spent: 45 minutes  Author: Montey Apa, DO 03/07/2024 9:13 AM  For on call review [email or web address].

## 2024-06-15 ENCOUNTER — Other Ambulatory Visit
Admission: RE | Admit: 2024-06-15 | Discharge: 2024-06-15 | Disposition: A | Discharge status: Home / Self Care | Attending: Ophthalmology | Admitting: Ophthalmology

## 2024-06-15 DIAGNOSIS — M316 Other giant cell arteritis: Secondary | ICD-10-CM | POA: Diagnosis present

## 2024-06-15 LAB — C-REACTIVE PROTEIN: CRP: 0.8 mg/dL (ref <1.0)

## 2024-06-15 LAB — CBC
HCT: 38 % (ref 36.0–46.0)
Hemoglobin: 12.3 g/dL (ref 12.0–15.0)
MCH: 30.1 pg (ref 26.0–34.0)
MCHC: 32.4 g/dL (ref 30.0–36.0)
MCV: 92.9 fL (ref 80.0–100.0)
Platelets: 245 K/uL (ref 150–400)
RBC: 4.09 MIL/uL (ref 3.87–5.11)
RDW: 12.8 % (ref 11.5–15.5)
WBC: 6.7 K/uL (ref 4.0–10.5)
nRBC: 0 % (ref 0.0–0.2)

## 2024-06-15 LAB — SEDIMENTATION RATE: Sed Rate: 20 mm/h (ref 0–30)

## 2024-06-26 ENCOUNTER — Other Ambulatory Visit (HOSPITAL_COMMUNITY): Payer: Self-pay
